# Patient Record
Sex: Female | Born: 2000 | Race: White | Hispanic: No | Marital: Single | Smoking: Never smoker
Health system: Southern US, Community
[De-identification: ages and names within clinical notes are randomized; demographics above are authoritative.]

## PROBLEM LIST (undated history)

## (undated) DIAGNOSIS — H539 Unspecified visual disturbance: Secondary | ICD-10-CM

## (undated) DIAGNOSIS — F419 Anxiety disorder, unspecified: Secondary | ICD-10-CM

## (undated) DIAGNOSIS — F32A Depression, unspecified: Secondary | ICD-10-CM

## (undated) DIAGNOSIS — T7840XA Allergy, unspecified, initial encounter: Secondary | ICD-10-CM

## (undated) DIAGNOSIS — F329 Major depressive disorder, single episode, unspecified: Secondary | ICD-10-CM

## (undated) HISTORY — PX: EYE SURGERY: SHX253

---
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
## 2018-08-04 ENCOUNTER — Ambulatory Visit
Admission: RE | Admit: 2018-08-04 | Discharge: 2018-08-04 | Disposition: A | Discharge status: Home / Self Care | Payer: Self-pay | Source: Ambulatory Visit | Attending: Pediatrics | Admitting: Pediatrics

## 2018-08-04 ENCOUNTER — Other Ambulatory Visit: Payer: Self-pay | Admitting: Pediatrics

## 2018-08-04 DIAGNOSIS — R059 Cough, unspecified: Secondary | ICD-10-CM

## 2018-08-04 DIAGNOSIS — R509 Fever, unspecified: Principal | ICD-10-CM

## 2018-08-04 DIAGNOSIS — R05 Cough: Secondary | ICD-10-CM

## 2018-12-02 ENCOUNTER — Encounter (HOSPITAL_COMMUNITY): Payer: Self-pay | Admitting: Psychiatry

## 2018-12-02 ENCOUNTER — Ambulatory Visit (INDEPENDENT_AMBULATORY_CARE_PROVIDER_SITE_OTHER): Payer: No Typology Code available for payment source | Admitting: Psychiatry

## 2018-12-02 ENCOUNTER — Other Ambulatory Visit: Payer: Self-pay

## 2018-12-02 VITALS — BP 127/80 | HR 78 | Temp 98.5°F | Ht 64.0 in | Wt 173.0 lb

## 2018-12-02 DIAGNOSIS — F4010 Social phobia, unspecified: Secondary | ICD-10-CM | POA: Diagnosis not present

## 2018-12-02 DIAGNOSIS — F4310 Post-traumatic stress disorder, unspecified: Secondary | ICD-10-CM

## 2018-12-02 DIAGNOSIS — F3341 Major depressive disorder, recurrent, in partial remission: Secondary | ICD-10-CM | POA: Diagnosis not present

## 2018-12-02 MED ORDER — FLUOXETINE HCL 20 MG PO TABS: 20.0000 mg | ORAL_TABLET | Freq: Every day | ORAL | 0 refills | Status: DC
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
## 2019-03-04 ENCOUNTER — Ambulatory Visit (INDEPENDENT_AMBULATORY_CARE_PROVIDER_SITE_OTHER): Payer: No Typology Code available for payment source | Admitting: Psychiatry

## 2019-03-04 ENCOUNTER — Other Ambulatory Visit: Payer: Self-pay

## 2019-03-04 DIAGNOSIS — F330 Major depressive disorder, recurrent, mild: Secondary | ICD-10-CM

## 2019-03-04 DIAGNOSIS — F4010 Social phobia, unspecified: Secondary | ICD-10-CM

## 2019-03-04 MED ORDER — FLUOXETINE HCL 20 MG PO TABS
30.0000 mg | ORAL_TABLET | Freq: Every day | ORAL | 0 refills | Status: DC
Start: 2019-03-04 — End: 2019-06-04
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
## 2019-06-04 ENCOUNTER — Ambulatory Visit (INDEPENDENT_AMBULATORY_CARE_PROVIDER_SITE_OTHER): Payer: No Typology Code available for payment source | Admitting: Psychiatry

## 2019-06-04 ENCOUNTER — Other Ambulatory Visit: Payer: Self-pay

## 2019-06-04 DIAGNOSIS — F4010 Social phobia, unspecified: Secondary | ICD-10-CM

## 2019-06-04 DIAGNOSIS — F4310 Post-traumatic stress disorder, unspecified: Secondary | ICD-10-CM | POA: Diagnosis not present

## 2019-06-04 DIAGNOSIS — F3342 Major depressive disorder, recurrent, in full remission: Secondary | ICD-10-CM

## 2019-06-04 MED ORDER — FLUOXETINE HCL 20 MG PO TABS: 30.0000 mg | ORAL_TABLET | Freq: Every day | ORAL | 1 refills | Status: DC
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
## 2019-06-08 ENCOUNTER — Encounter (HOSPITAL_COMMUNITY): Payer: Self-pay

## 2019-09-07 ENCOUNTER — Other Ambulatory Visit: Payer: Self-pay

## 2019-09-07 ENCOUNTER — Ambulatory Visit (INDEPENDENT_AMBULATORY_CARE_PROVIDER_SITE_OTHER): Payer: No Typology Code available for payment source | Admitting: Psychiatry

## 2019-09-07 DIAGNOSIS — F4010 Social phobia, unspecified: Secondary | ICD-10-CM | POA: Diagnosis not present

## 2019-09-07 DIAGNOSIS — F330 Major depressive disorder, recurrent, mild: Secondary | ICD-10-CM

## 2019-09-07 MED ORDER — FLUOXETINE HCL 40 MG PO CAPS: 40.0000 mg | ORAL_CAPSULE | Freq: Every day | ORAL | 1 refills | Status: DC
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
## 2019-11-30 ENCOUNTER — Emergency Department (HOSPITAL_COMMUNITY): Payer: No Typology Code available for payment source

## 2019-11-30 ENCOUNTER — Observation Stay (HOSPITAL_COMMUNITY)
Admission: EM | Admit: 2019-11-30 | Discharge: 2019-12-02 | Disposition: A | Discharge status: Home / Self Care | Payer: No Typology Code available for payment source | Attending: Surgery | Admitting: Surgery

## 2019-11-30 ENCOUNTER — Encounter (HOSPITAL_COMMUNITY): Payer: Self-pay | Admitting: Emergency Medicine

## 2019-11-30 ENCOUNTER — Other Ambulatory Visit: Payer: Self-pay

## 2019-11-30 DIAGNOSIS — F419 Anxiety disorder, unspecified: Secondary | ICD-10-CM | POA: Insufficient documentation

## 2019-11-30 DIAGNOSIS — Z79899 Other long term (current) drug therapy: Secondary | ICD-10-CM | POA: Diagnosis not present

## 2019-11-30 DIAGNOSIS — K5909 Other constipation: Secondary | ICD-10-CM | POA: Diagnosis not present

## 2019-11-30 DIAGNOSIS — R599 Enlarged lymph nodes, unspecified: Secondary | ICD-10-CM | POA: Insufficient documentation

## 2019-11-30 DIAGNOSIS — Z8616 Personal history of COVID-19: Secondary | ICD-10-CM | POA: Diagnosis not present

## 2019-11-30 DIAGNOSIS — K8000 Calculus of gallbladder with acute cholecystitis without obstruction: Secondary | ICD-10-CM | POA: Diagnosis not present

## 2019-11-30 DIAGNOSIS — R350 Frequency of micturition: Secondary | ICD-10-CM | POA: Insufficient documentation

## 2019-11-30 DIAGNOSIS — F329 Major depressive disorder, single episode, unspecified: Secondary | ICD-10-CM | POA: Diagnosis not present

## 2019-11-30 DIAGNOSIS — Z793 Long term (current) use of hormonal contraceptives: Secondary | ICD-10-CM | POA: Insufficient documentation

## 2019-11-30 DIAGNOSIS — R1013 Epigastric pain: Secondary | ICD-10-CM | POA: Diagnosis present

## 2019-11-30 DIAGNOSIS — Z20822 Contact with and (suspected) exposure to covid-19: Secondary | ICD-10-CM | POA: Insufficient documentation

## 2019-11-30 DIAGNOSIS — Z885 Allergy status to narcotic agent status: Secondary | ICD-10-CM | POA: Diagnosis not present

## 2019-11-30 DIAGNOSIS — K810 Acute cholecystitis: Secondary | ICD-10-CM | POA: Diagnosis present

## 2019-11-30 LAB — COMPREHENSIVE METABOLIC PANEL
ALT: 53 U/L — ABNORMAL HIGH (ref 0–44)
AST: 28 U/L (ref 15–41)
Albumin: 4.2 g/dL (ref 3.5–5.0)
Alkaline Phosphatase: 76 U/L (ref 38–126)
Anion gap: 12 (ref 5–15)
BUN: 7 mg/dL (ref 6–20)
CO2: 23 mmol/L (ref 22–32)
Calcium: 9.2 mg/dL (ref 8.9–10.3)
Chloride: 105 mmol/L (ref 98–111)
Creatinine, Ser: 0.52 mg/dL (ref 0.44–1.00)
GFR calc Af Amer: >60 mL/min (ref >60)
GFR calc non Af Amer: >60 mL/min (ref >60)
Glucose, Bld: 124 mg/dL — ABNORMAL HIGH (ref 70–99)
Potassium: 3.6 mmol/L (ref 3.5–5.1)
Sodium: 140 mmol/L (ref 135–145)
Total Bilirubin: 0.3 mg/dL (ref 0.3–1.2)
Total Protein: 7.9 g/dL (ref 6.5–8.1)

## 2019-11-30 LAB — CBC
HCT: 41.5 % (ref 36.0–46.0)
Hemoglobin: 13.3 g/dL (ref 12.0–15.0)
MCH: 28.2 pg (ref 26.0–34.0)
MCHC: 32 g/dL (ref 30.0–36.0)
MCV: 88.1 fL (ref 80.0–100.0)
Platelets: 241 10*3/uL (ref 150–400)
RBC: 4.71 MIL/uL (ref 3.87–5.11)
RDW: 12.6 % (ref 11.5–15.5)
WBC: 6.9 10*3/uL (ref 4.0–10.5)
nRBC: 0 % (ref 0.0–0.2)

## 2019-11-30 LAB — HEPATITIS PANEL, ACUTE
HCV Ab: NONREACTIVE
Hep A IgM: NONREACTIVE
Hep B C IgM: NONREACTIVE
Hepatitis B Surface Ag: NONREACTIVE

## 2019-11-30 LAB — URINALYSIS, ROUTINE W REFLEX MICROSCOPIC
Bilirubin Urine: NEGATIVE
Glucose, UA: NEGATIVE mg/dL
Hgb urine dipstick: NEGATIVE
Ketones, ur: NEGATIVE mg/dL
Leukocytes,Ua: NEGATIVE
Nitrite: NEGATIVE
Protein, ur: NEGATIVE mg/dL
Specific Gravity, Urine: 1.009 (ref 1.005–1.030)
pH: 6 (ref 5.0–8.0)

## 2019-11-30 LAB — RESPIRATORY PANEL BY RT PCR (FLU A&B, COVID)
Influenza A by PCR: NEGATIVE
Influenza B by PCR: NEGATIVE
SARS Coronavirus 2 by RT PCR: NEGATIVE

## 2019-11-30 LAB — I-STAT BETA HCG BLOOD, ED (MC, WL, AP ONLY): I-stat hCG, quantitative: <5 m[IU]/mL (ref <5)

## 2019-11-30 LAB — HIV ANTIBODY (ROUTINE TESTING W REFLEX): HIV Screen 4th Generation wRfx: NONREACTIVE

## 2019-11-30 LAB — LIPASE, BLOOD: Lipase: 30 U/L (ref 11–51)

## 2019-11-30 LAB — LACTIC ACID, PLASMA: Lactic Acid, Venous: 1.2 mmol/L (ref 0.5–1.9)

## 2019-11-30 MED ORDER — OXYCODONE HCL 5 MG PO TABS
5.0000 mg | ORAL_TABLET | ORAL | Status: DC | PRN
  Administered 2019-11-30 – 2019-12-01 (×2): 10 mg via ORAL
  Filled 2019-11-30 (×2): qty 2

## 2019-11-30 MED ORDER — ACETAMINOPHEN 500 MG PO TABS
1000.0000 mg | ORAL_TABLET | ORAL | Status: AC
  Administered 2019-12-01: 10:00:00 1000 mg via ORAL
  Filled 2019-11-30: qty 2

## 2019-11-30 MED ORDER — DIPHENHYDRAMINE HCL 50 MG/ML IJ SOLN
INTRAMUSCULAR | Status: AC
  Filled 2019-11-30: qty 1

## 2019-11-30 MED ORDER — ALUM & MAG HYDROXIDE-SIMETH 200-200-20 MG/5ML PO SUSP
30.0000 mL | Freq: Once | ORAL | Status: AC
  Administered 2019-11-30: 30 mL via ORAL
  Filled 2019-11-30: qty 30

## 2019-11-30 MED ORDER — CELECOXIB 200 MG PO CAPS
400.0000 mg | ORAL_CAPSULE | ORAL | Status: AC
  Administered 2019-12-01: 400 mg via ORAL
  Filled 2019-11-30: qty 2

## 2019-11-30 MED ORDER — ONDANSETRON HCL 4 MG/2ML IJ SOLN
4.0000 mg | Freq: Four times a day (QID) | INTRAMUSCULAR | Status: DC | PRN
  Administered 2019-11-30: 4 mg via INTRAVENOUS
  Filled 2019-11-30: qty 2

## 2019-11-30 MED ORDER — FENTANYL CITRATE (PF) 100 MCG/2ML IJ SOLN
50.0000 ug | Freq: Once | INTRAMUSCULAR | Status: AC
  Administered 2019-11-30: 50 ug via INTRAVENOUS
  Filled 2019-11-30: qty 2

## 2019-11-30 MED ORDER — FENTANYL CITRATE (PF) 100 MCG/2ML IJ SOLN
50.0000 ug | Freq: Once | INTRAMUSCULAR | Status: AC
  Administered 2019-11-30: 12:00:00 50 ug via INTRAVENOUS
  Filled 2019-11-30: qty 2

## 2019-11-30 MED ORDER — ONDANSETRON HCL 4 MG/2ML IJ SOLN
4.0000 mg | Freq: Once | INTRAMUSCULAR | Status: AC
  Administered 2019-11-30: 4 mg via INTRAVENOUS
  Filled 2019-11-30: qty 2

## 2019-11-30 MED ORDER — ENSURE PRE-SURGERY PO LIQD
592.0000 mL | Freq: Once | ORAL | Status: AC
  Administered 2019-11-30: 592 mL via ORAL
  Filled 2019-11-30: qty 592

## 2019-11-30 MED ORDER — SODIUM CHLORIDE 0.9% FLUSH
3.0000 mL | Freq: Once | INTRAVENOUS | Status: AC
  Administered 2019-11-30: 09:00:00 3 mL via INTRAVENOUS

## 2019-11-30 MED ORDER — DIPHENHYDRAMINE HCL 50 MG/ML IJ SOLN
12.5000 mg | Freq: Four times a day (QID) | INTRAMUSCULAR | Status: DC | PRN
  Administered 2019-11-30: 12.5 mg via INTRAVENOUS
  Administered 2019-12-01: 25 mg via INTRAVENOUS
  Filled 2019-11-30 (×2): qty 1

## 2019-11-30 MED ORDER — ENOXAPARIN SODIUM 40 MG/0.4ML ~~LOC~~ SOLN: 40.0000 mg | SUBCUTANEOUS | Status: DC

## 2019-11-30 MED ORDER — SODIUM CHLORIDE 0.9 % IV SOLN
2.0000 g | INTRAVENOUS | Status: DC
  Administered 2019-11-30: 2 g via INTRAVENOUS
  Filled 2019-11-30 (×2): qty 20

## 2019-11-30 MED ORDER — SODIUM CHLORIDE (PF) 0.9 % IJ SOLN
INTRAMUSCULAR | Status: AC
  Filled 2019-11-30: qty 50

## 2019-11-30 MED ORDER — ACETAMINOPHEN 325 MG PO TABS
650.0000 mg | ORAL_TABLET | Freq: Four times a day (QID) | ORAL | Status: DC | PRN
  Administered 2019-12-01: 650 mg via ORAL
  Filled 2019-11-30 (×2): qty 2

## 2019-11-30 MED ORDER — ACETAMINOPHEN 650 MG RE SUPP: 650.0000 mg | Freq: Four times a day (QID) | RECTAL | Status: DC | PRN

## 2019-11-30 MED ORDER — LIDOCAINE VISCOUS HCL 2 % MT SOLN
15.0000 mL | Freq: Once | OROMUCOSAL | Status: AC
  Administered 2019-11-30: 15 mL via ORAL
  Filled 2019-11-30: qty 15

## 2019-11-30 MED ORDER — GABAPENTIN 300 MG PO CAPS
300.0000 mg | ORAL_CAPSULE | ORAL | Status: AC
  Administered 2019-12-01: 10:00:00 300 mg via ORAL
  Filled 2019-11-30: qty 1

## 2019-11-30 MED ORDER — MORPHINE SULFATE (PF) 4 MG/ML IV SOLN
4.0000 mg | Freq: Once | INTRAVENOUS | Status: AC
  Administered 2019-11-30: 4 mg via INTRAVENOUS
  Filled 2019-11-30: qty 1

## 2019-11-30 MED ORDER — HYDROMORPHONE HCL 1 MG/ML IJ SOLN
0.5000 mg | INTRAMUSCULAR | Status: DC | PRN
  Administered 2019-11-30 – 2019-12-01 (×6): 1 mg via INTRAVENOUS
  Filled 2019-11-30 (×6): qty 1

## 2019-11-30 MED ORDER — ONDANSETRON 4 MG PO TBDP
4.0000 mg | ORAL_TABLET | Freq: Four times a day (QID) | ORAL | Status: DC | PRN
  Administered 2019-12-01: 4 mg via ORAL
  Filled 2019-11-30: qty 1

## 2019-11-30 MED ORDER — METOPROLOL TARTRATE 5 MG/5ML IV SOLN: 5.0000 mg | Freq: Four times a day (QID) | INTRAVENOUS | Status: DC | PRN

## 2019-11-30 MED ORDER — IOHEXOL 300 MG/ML  SOLN
100.0000 mL | Freq: Once | INTRAMUSCULAR | Status: AC | PRN
  Administered 2019-11-30: 100 mL via INTRAVENOUS

## 2019-11-30 MED ORDER — DIPHENHYDRAMINE HCL 50 MG/ML IJ SOLN
25.0000 mg | Freq: Once | INTRAMUSCULAR | Status: AC
  Administered 2019-11-30: 10:00:00 25 mg via INTRAVENOUS

## 2019-11-30 MED ORDER — SODIUM CHLORIDE 0.9 % IV BOLUS
1000.0000 mL | Freq: Once | INTRAVENOUS | Status: AC
  Administered 2019-11-30: 1000 mL via INTRAVENOUS

## 2019-11-30 MED ORDER — ENSURE PRE-SURGERY PO LIQD
296.0000 mL | Freq: Once | ORAL | Status: AC
  Administered 2019-12-01: 296 mL via ORAL
  Filled 2019-11-30: qty 296

## 2019-11-30 NOTE — ED Notes (Signed)
Transport called.

## 2019-11-30 NOTE — ED Triage Notes (Signed)
Patient here from home reporting upper abd pain radiating around to lower back increased over 2 days. Nausea no vomiting. Denies pregnancy.

## 2019-11-30 NOTE — ED Provider Notes (Signed)
[facility]-EMERGENCY DEPT Provider Note   CSN: [id number] Arrival date & time: 11/30/19  0857     History Chief Complaint  Patient presents with   Abdominal Pain   Back Pain    Selena Baker is a 19 y.o. female.  The history is provided by the patient and medical records. No language interpreter was used.  Abdominal Pain Pain location:  Epigastric, RUQ and LUQ Pain quality: aching   Pain radiates to:  Back Pain severity:  Severe Onset quality:  Gradual Duration:  2 days Timing:  Constant Progression:  Worsening Chronicity:  New Relieved by:  Nothing Worsened by:  Nothing Ineffective treatments:  Acetaminophen Associated symptoms: constipation (chronic), nausea and vomiting   Associated symptoms: no chest pain, no chills, no cough, no diarrhea, no dysuria, no fatigue, no fever, no shortness of breath, no vaginal bleeding and no vaginal discharge        Past Medical History:  Diagnosis Date   Allergy    Pollen   Anxiety    Depression    Vision abnormalities    Myopia, wears glasses    Patient Active Problem List   Diagnosis Date Noted   PTSD (post-traumatic stress disorder) 12/02/2018   Major depressive disorder, recurrent episode, mild (HCC) 04/07/2013   Social anxiety disorder 04/07/2013    Past Surgical History:  Procedure Laterality Date   EYE SURGERY     both eyes...possible lazy eye     OB History   No obstetric history on file.     Family History  Problem Relation Age of Onset   Breast cancer Mother        finished chemo and radiation therapy    Social History   Tobacco Use   Smoking status: Never Smoker   Smokeless tobacco: Never Used  Substance Use Topics   Alcohol use: No   Drug use: No    Home Medications Prior to Admission medications   Medication Sig Start Date End Date Taking? Authorizing Provider  FLUoxetine (PROZAC) 40 MG capsule Take 1 capsule (40 mg total) by mouth daily. 09/07/19  03/05/20  Pucilowski, Roosvelt Maser, MD  levonorgestrel-ethinyl estradiol (NORDETTE) 0.15-30 MG-MCG tablet  11/05/18   [provider]    Allergies    Patient has no known allergies.  Review of Systems   Review of Systems  Constitutional: Negative for chills, diaphoresis, fatigue and fever.  HENT: Negative for congestion.   Respiratory: Negative for cough, chest tightness, shortness of breath and wheezing.   Cardiovascular: Negative for chest pain, palpitations and leg swelling.  Gastrointestinal: Positive for abdominal pain, constipation (chronic), nausea and vomiting. Negative for abdominal distention and diarrhea.  Genitourinary: Positive for frequency. Negative for dysuria, enuresis, flank pain, vaginal bleeding and vaginal discharge.  Musculoskeletal: Positive for back pain. Negative for neck pain and neck stiffness.  Skin: Negative for rash and wound.  Neurological: Negative for light-headedness and headaches.  Psychiatric/Behavioral: Negative for agitation.  All other systems reviewed and are negative.   Physical Exam Updated Vital Signs BP (!) 136/102 (BP Location: Right Arm)    Pulse 89    Temp 98 F (36.7 C) (Oral)    Resp 19    SpO2 100%   Physical Exam Vitals and nursing note reviewed.  Constitutional:      General: She is not in acute distress.    Appearance: She is well-developed. She is not ill-appearing, toxic-appearing or diaphoretic.  HENT:     Head: Normocephalic and atraumatic.  Right Ear: External ear normal.     Left Ear: External ear normal.     Nose: Nose normal.  Eyes:     General: No scleral icterus.    Conjunctiva/sclera: Conjunctivae normal.  Cardiovascular:     Rate and Rhythm: Normal rate.     Heart sounds: Normal heart sounds. No murmur.  Pulmonary:     Effort: Pulmonary effort is normal. No respiratory distress.     Breath sounds: No stridor. No wheezing, rhonchi or rales.  Chest:     Chest wall: No tenderness.  Abdominal:      General: Abdomen is flat. There is no distension.     Tenderness: There is abdominal tenderness in the right upper quadrant, epigastric area and left upper quadrant. There is left CVA tenderness. There is no right CVA tenderness, guarding or rebound.  Musculoskeletal:     Cervical back: Normal range of motion and neck supple.  Skin:    General: Skin is warm.     Coloration: Skin is not pale.     Findings: No erythema or rash.  Neurological:     General: No focal deficit present.     Mental Status: She is alert.     Motor: No abnormal muscle tone.     Coordination: Coordination normal.     Deep Tendon Reflexes: Reflexes are normal and symmetric.  Psychiatric:        Mood and Affect: Mood is anxious.     ED Results / Procedures / Treatments   Labs (all labs ordered are listed, but only abnormal results are displayed) Labs Reviewed  COMPREHENSIVE METABOLIC PANEL - Abnormal; Notable for the following components:      Result Value   Glucose, Bld 124 (*)    ALT 53 (*)    All other components within normal limits  URINE CULTURE  RESPIRATORY PANEL BY RT PCR (FLU A&B, COVID)  LIPASE, BLOOD  CBC  URINALYSIS, ROUTINE W REFLEX MICROSCOPIC  LACTIC ACID, PLASMA  HEPATITIS PANEL, ACUTE  HIV ANTIBODY (ROUTINE TESTING W REFLEX)  CBC  CREATININE, SERUM  I-STAT BETA HCG BLOOD, ED ([facility], [facility], [facility] ONLY)    EKG None  Radiology CT ABDOMEN PELVIS W CONTRAST  Result Date: 11/30/2019 CLINICAL DATA:  Upper abdominal pain with nausea and vomiting. EXAM: CT ABDOMEN AND PELVIS WITH CONTRAST TECHNIQUE: Multidetector CT imaging of the abdomen and pelvis was performed using the standard protocol following bolus administration of intravenous contrast. CONTRAST:  OMNIPAQUE IOHEXOL 300 MG/ML  SOLN COMPARISON:  Abdominal ultrasound dated 11/30/2019 FINDINGS: Lower chest: Normal. Hepatobiliary: Liver parenchyma is normal. Distention of the gallbladder with edema in the gallbladder wall which was not  apparent on the prior ultrasound exam. No dilated bile ducts. Pancreas: Unremarkable. No pancreatic ductal dilatation or surrounding inflammatory changes. Spleen: Normal in size without focal abnormality. Adrenals/Urinary Tract: Adrenal glands are unremarkable. Kidneys are normal, without renal calculi, focal lesion, or hydronephrosis. Bladder is unremarkable. Stomach/Bowel: Slight edema in the mucosa of the antrum of the stomach. Slight prominence of the mucosa of the colon is felt to be due to lack of distension. The slightly distended mid transverse colon shows no mucosal edema. The small bowel is normal including the terminal ileum. The appendix is normal. Vascular/Lymphatic: No significant vascular findings are present. No enlarged abdominal or pelvic lymph nodes. Reproductive: Uterus and bilateral adnexa are unremarkable. Other: Tiny amount of free fluid in the pelvis, normal for a female of this age. Musculoskeletal: No acute or significant  osseous findings. IMPRESSION: 1. Distention of the gallbladder with edema in the gallbladder wall. This could represent acalculous cholecystitis. 2. Slight edema in the mucosa of the antrum of the stomach. 3. Otherwise benign appearing abdomen and pelvis. Electronically Signed   By: Lorriane Shire M.D.   On: 11/30/2019 13:38   US Abdomen Limited RUQ  Result Date: 11/30/2019 CLINICAL DATA:  Vomiting and abdominal pain EXAM: ULTRASOUND ABDOMEN LIMITED RIGHT UPPER QUADRANT COMPARISON:  None. FINDINGS: Gallbladder: No gallstones or wall thickening visualized. No sonographic Murphy sign noted by sonographer. Common bile duct: Diameter: 3 mm Liver: No focal lesion identified. Within normal limits in parenchymal echogenicity. Portal vein is patent on color Doppler imaging with normal direction of blood flow towards the liver. IMPRESSION: Normal study Electronically Signed   By: Monte Fantasia M.D.   On: 11/30/2019 10:35    Procedures Procedures (including critical care  time)  Medications Ordered in ED Medications  enoxaparin (LOVENOX) injection 40 mg (has no administration in time range)  cefTRIAXone (ROCEPHIN) 2 g in sodium chloride 0.9 % 100 mL IVPB (has no administration in time range)  acetaminophen (TYLENOL) tablet 650 mg (has no administration in time range)    Or  acetaminophen (TYLENOL) suppository 650 mg (has no administration in time range)  oxyCODONE (Oxy IR/ROXICODONE) immediate release tablet 5-10 mg (has no administration in time range)  HYDROmorphone (DILAUDID) injection 0.5-1 mg (has no administration in time range)  ondansetron (ZOFRAN-ODT) disintegrating tablet 4 mg (has no administration in time range)    Or  ondansetron (ZOFRAN) injection 4 mg (has no administration in time range)  metoprolol tartrate (LOPRESSOR) injection 5 mg (has no administration in time range)  sodium chloride flush (NS) 0.9 % injection 3 mL (3 mLs Intravenous Given 11/30/19 0911)  ondansetron (ZOFRAN) injection 4 mg (4 mg Intravenous Given 11/30/19 0929)  sodium chloride 0.9 % bolus 1,000 mL (0 mLs Intravenous Stopped 11/30/19 1028)  alum & mag hydroxide-simeth (MAALOX/MYLANTA) 200-200-20 MG/5ML suspension 30 mL (30 mLs Oral Given 11/30/19 0929)    And  lidocaine (XYLOCAINE) 2 % viscous mouth solution 15 mL (15 mLs Oral Given 11/30/19 0929)  morphine 4 MG/ML injection 4 mg (4 mg Intravenous Given 11/30/19 0929)  diphenhydrAMINE (BENADRYL) injection 25 mg (25 mg Intravenous Given 11/30/19 1025)  ondansetron (ZOFRAN) injection 4 mg (4 mg Intravenous Given 11/30/19 1053)  fentaNYL (SUBLIMAZE) injection 50 mcg (50 mcg Intravenous Given 11/30/19 1209)  iohexol (OMNIPAQUE) 300 MG/ML solution 100 mL (100 mLs Intravenous Contrast Given 11/30/19 1311)  fentaNYL (SUBLIMAZE) injection 50 mcg (50 mcg Intravenous Given 11/30/19 1416)    ED Course  I have reviewed the triage vital signs and the nursing notes.  Pertinent labs & imaging results that were available during my care of  the patient were reviewed by me and considered in my medical decision making (see chart for details).    MDM Rules/Calculators/A&P                      Selena Baker is a 19 y.o. female with a past medical history significant for anxiety, depression, and PTSD who presents with nausea, vomiting, abdominal pain.  She reports that for the last few days she has had worsening pain in her upper abdomen that radiates around towards her back.  She reports has had nausea and vomiting all night with her onset episodes of emesis overnight.  She reports chronic constipation and no changes there if she still passing gas and has  no diarrhea.  She does report urinary frequency but denies dysuria.  She denies any vaginal symptoms and has not missed her menstrual cycle.  She does take birth control medications.  She denies any recent medication changes or alcohol use.  She denies trauma.  She denies fevers, chills, chest pain, shortness of breath, congestion, or cough.  No recent Covid symptoms.  On exam, patient does have tenderness in her upper abdomen.  She also some tenderness in her left CVA area.  Bowel sounds were appreciated.  Lungs were clear and chest was nontender.  Good pulses in extremities.  No lower abdominal tenderness.  Given her upper abdominal pain and tenderness, I suspect gastritis versus hepatitis versus gallbladder disease versus other.  We agreed to get ultrasound given the epigastric and right upper quadrant tenderness as well as give her nausea medicine, pain medicine, fluids.  We will also do a GI cocktail.  With her frequency, will get urine to make sure she is pyelonephritis with the CVA tenderness.  Low suspicion for stone and she has no history of stone or gallbladder disease.  Anticipate reassessment after work-up.  9:36 AM Nursing just reported to me that patient reports that she smoked marijuana yesterday.  I want to clarify with her and patient says that her abdominal symptoms were  present before she smoked marijuana, doubt new marijuana induced hyperemesis based on the timeline.  Awaiting results  10:32 AM Work-up has begun to recurrent is reassuring thus far.  Urinalysis does not show infection.  Lactic acid CBC and CMP overall reassuring.  ALT slightly elevated but do not feel she has significant elevation.  Ultrasound is still in process.  After getting the medications, patient did have a chest erythematous rash develop, unclear which medication started this but it was likely the morphine.  Will hold on further morphine at this time and will give patient Benadryl.  Anticipate reassessment.  Patient may need CT scan if she is still having severe abdominal discomfort.  Patient's abdominal pain continued despite a reassuring ultrasound.  CT was ordered and showed concern for acalculous cholecystitis.  General surgery was called who will admit for further management and possible surgery tomorrow.  A 2-hour Covid was ordered in case she has to go the OR tonight.  Patient be admitted by surgery for further management.   Final Clinical Impression(s) / ED Diagnoses Final diagnoses:  Epigastric abdominal pain    Clinical Impression: 1. Epigastric abdominal pain     Disposition: Admit  This note was prepared with assistance of Dragon voice recognition software. Occasional wrong-word or sound-a-like substitutions may have occurred due to the inherent limitations of voice recognition software.     Jhostin Epps, Canary Brim, MD 11/30/19 [id number]

## 2019-11-30 NOTE — H&P (Signed)
[facility] Admission Note  Selena Baker [birth year]  [id number].    Requesting MD: Tegeler Chief Complaint/Reason for Consult: Acalculous cholecystitis HPI:  Patient is a 19 year old female who presented to [facility] with 3 days of upper abdominal pain, nausea and vomiting. Patient reports that she has had milder episodes of similar pain since June of last year. Current episode pain much more severe and has been constant. Pain in epigastrium and radiates to back. She reports associated nausea and NBNB emesis as well as some diarrhea last Friday. Denies fever, chills, chest pain, SOB, urinary symptoms. Patient has not had any sick contacts with similar symptoms. PMH otherwise significant for depression/anxiety and COVID infection back in August of 2020. She recently also received the Massiah Longanecker&Lela Murfin vaccine a week prior to this past Friday. NKDA. No blood thinning medications. No past abdominal surgery. Patient denies alcohol and tobacco use, occasional marijuana use. She is a Consulting civil engineer at [employer].   ROS: Review of Systems  Constitutional: Negative for chills and fever.  Respiratory: Negative for shortness of breath and wheezing.   Cardiovascular: Negative for chest pain and palpitations.  Gastrointestinal: Positive for abdominal pain, diarrhea, nausea and vomiting. Negative for blood in stool, constipation and melena.  Genitourinary: Negative for dysuria, frequency and urgency.  Musculoskeletal: Positive for back pain.  All other systems reviewed and are negative.   Family History  Problem Relation Age of Onset  . Breast cancer Mother        finished chemo and radiation therapy    Past Medical History:  Diagnosis Date  . Allergy    Pollen  . Anxiety   . Depression   . Vision abnormalities    Myopia, wears glasses    Past Surgical History:  Procedure Laterality Date  . EYE SURGERY     both eyes...possible lazy eye    Social History:  reports that she has never smoked. She  has never used smokeless tobacco. She reports that she does not drink alcohol or use drugs.  Allergies: No Known Allergies  (Not in a hospital admission)   Blood pressure (!) 141/82, pulse 71, temperature 98 F (36.7 C), temperature source Oral, resp. rate 18, last menstrual period 11/02/2019, SpO2 100 %. Physical Exam:  General: pleasant, WD, overweight white female who is laying in bed in NAD HEENT: Sclera are anicteric.  PERRL.  Ears and nose without any masses or lesions.  Mouth is pink and moist Heart: regular, rate, and rhythm.  Normal s1,s2. No obvious murmurs, gallops, or rubs noted.  Palpable radial and pedal pulses bilaterally Lungs: CTAB, no wheezes, rhonchi, or rales noted.  Respiratory effort nonlabored Abd: soft, ttp in RUQ, +Murphy sign, ND, +BS, no masses, hernias, or organomegaly MS: all 4 extremities are symmetrical with no cyanosis, clubbing, or edema. Skin: warm and dry with no masses, lesions, or rashes Neuro: Cranial nerves 2-12 grossly intact, sensation grossly intact throughout Psych: A&Ox3 with an appropriate affect.   Results for orders placed or performed during the hospital encounter of 11/30/19 (from the past 48 hour(s))  Lipase, blood     Status: None   Collection Time: 11/30/19  9:11 AM  Result Value Ref Range   Lipase 30 11 - 51 U/L    Comment: Performed at [facility], [address], [place], [place] [address]  Comprehensive metabolic panel     Status: Abnormal   Collection Time: 11/30/19  9:11 AM  Result Value Ref Range   Sodium 140 135 -  145 mmol/L   Potassium 3.6 3.5 - 5.1 mmol/L   Chloride 105 98 - 111 mmol/L   CO2 23 22 - 32 mmol/L   Glucose, Bld 124 (H) 70 - 99 mg/dL    Comment: Glucose reference range applies only to samples taken after fasting for at least 8 hours.   BUN 7 6 - 20 mg/dL   Creatinine, Ser 3.81 0.44 - 1.00 mg/dL   Calcium 9.2 8.9 - 01.7 mg/dL   Total Protein 7.9 6.5 - 8.1 g/dL   Albumin 4.2 3.5 - 5.0  g/dL   AST 28 15 - 41 U/L   ALT 53 (H) 0 - 44 U/L   Alkaline Phosphatase 76 38 - 126 U/L   Total Bilirubin 0.3 0.3 - 1.2 mg/dL   GFR calc non Af Amer >60 >60 mL/min   GFR calc Af Amer >60 >60 mL/min   Anion gap 12 5 - 15    Comment: Performed at [facility], [address] [place], [place] [address]  CBC     Status: None   Collection Time: 11/30/19  9:11 AM  Result Value Ref Range   WBC 6.9 4.0 - 10.5 K/uL   RBC 4.71 3.87 - 5.11 MIL/uL   Hemoglobin 13.3 12.0 - 15.0 g/dL   HCT 85.2 77.8 - 24.2 %   MCV 88.1 80.0 - 100.0 fL   MCH 28.2 26.0 - 34.0 pg   MCHC 32.0 30.0 - 36.0 g/dL   RDW 35.3 61.4 - 43.1 %   Platelets 241 150 - 400 K/uL   nRBC 0.0 0.0 - 0.2 %    Comment: Performed at [facility], [address], [place], [place] [address]  I-Stat beta hCG blood, ED     Status: None   Collection Time: 11/30/19  9:25 AM  Result Value Ref Range   I-stat hCG, quantitative <5.0 <5 mIU/mL   Comment 3            Comment:   GEST. AGE      CONC.  (mIU/mL)   <=1 WEEK        5 - 50     2 WEEKS       50 - 500     3 WEEKS       100 - 10,000     4 WEEKS     1,000 - 30,000        FEMALE AND NON-PREGNANT FEMALE:     LESS THAN 5 mIU/mL   Lactic acid, plasma     Status: None   Collection Time: 11/30/19  9:28 AM  Result Value Ref Range   Lactic Acid, Venous 1.2 0.5 - 1.9 mmol/L    Comment: Performed at [facility], [address] [place], [place] [address]  Urinalysis, Routine w reflex microscopic     Status: None   Collection Time: 11/30/19  9:57 AM  Result Value Ref Range   Color, Urine YELLOW YELLOW   APPearance CLEAR CLEAR   Specific Gravity, Urine 1.009 1.005 - 1.030   pH 6.0 5.0 - 8.0   Glucose, UA NEGATIVE NEGATIVE mg/dL   Hgb urine dipstick NEGATIVE NEGATIVE   Bilirubin Urine NEGATIVE NEGATIVE   Ketones, ur NEGATIVE NEGATIVE mg/dL   Protein, ur NEGATIVE NEGATIVE mg/dL   Nitrite NEGATIVE NEGATIVE   Leukocytes,Ua NEGATIVE  NEGATIVE    Comment: Performed at [facility], [address], [place], [place] [address]   CT  ABDOMEN PELVIS W CONTRAST  Result Date: 11/30/2019 CLINICAL DATA:  Upper abdominal pain with nausea and vomiting. EXAM: CT ABDOMEN AND PELVIS WITH CONTRAST TECHNIQUE: Multidetector CT imaging of the abdomen and pelvis was performed using the standard protocol following bolus administration of intravenous contrast. CONTRAST:  115mL OMNIPAQUE IOHEXOL 300 MG/ML  SOLN COMPARISON:  Abdominal ultrasound dated 11/30/2019 FINDINGS: Lower chest: Normal. Hepatobiliary: Liver parenchyma is normal. Distention of the gallbladder with edema in the gallbladder wall which was not apparent on the prior ultrasound exam. No dilated bile ducts. Pancreas: Unremarkable. No pancreatic ductal dilatation or surrounding inflammatory changes. Spleen: Normal in size without focal abnormality. Adrenals/Urinary Tract: Adrenal glands are unremarkable. Kidneys are normal, without renal calculi, focal lesion, or hydronephrosis. Bladder is unremarkable. Stomach/Bowel: Slight edema in the mucosa of the antrum of the stomach. Slight prominence of the mucosa of the colon is felt to be due to lack of distension. The slightly distended mid transverse colon shows no mucosal edema. The small bowel is normal including the terminal ileum. The appendix is normal. Vascular/Lymphatic: No significant vascular findings are present. No enlarged abdominal or pelvic lymph nodes. Reproductive: Uterus and bilateral adnexa are unremarkable. Other: Tiny amount of free fluid in the pelvis, normal for a female of this age. Musculoskeletal: No acute or significant osseous findings. IMPRESSION: 1. Distention of the gallbladder with edema in the gallbladder wall. This could represent acalculous cholecystitis. 2. Slight edema in the mucosa of the antrum of the stomach. 3. Otherwise benign appearing abdomen and pelvis. Electronically Signed   By: Lorriane Shire M.D.   On: 11/30/2019 13:38   US Abdomen Limited RUQ  Result Date: 11/30/2019 CLINICAL DATA:  Vomiting and abdominal pain EXAM: ULTRASOUND ABDOMEN LIMITED RIGHT UPPER QUADRANT COMPARISON:  None. FINDINGS: Gallbladder: No gallstones or wall thickening visualized. No sonographic Murphy sign noted by sonographer. Common bile duct: Diameter: 3 mm Liver: No focal lesion identified. Within normal limits in parenchymal echogenicity. Portal vein is patent on color Doppler imaging with normal direction of blood flow towards the liver. IMPRESSION: Normal study Electronically Signed   By: Monte Fantasia M.D.   On: 11/30/2019 10:35      Assessment/Plan Acalculous cholecystitis - [place] normal, CT with mild edema in gallbladder wall - ALT mildly elevated - LFTs otherwise normal - WBC 6.9 and patient is afebrile - exam with TTP in RUQ and positive Murphy sign - discussed with attending provider and HIDA not felt to be needed at this time given CT findings - check hepatitis panel and repeat labs in AM - ok to have CLD but will make NPO after MN and likely plan for lap chole in the AM  Admit to observation. Reassess for possible OR tomorrow morning.   Brigid Re, [facility] Surgery 11/30/2019, 2:53 PM Please see Amion for pager number during day hours 7:00am-4:30pm

## 2019-12-01 ENCOUNTER — Observation Stay (HOSPITAL_COMMUNITY): Payer: No Typology Code available for payment source | Admitting: Anesthesiology

## 2019-12-01 ENCOUNTER — Ambulatory Visit (INDEPENDENT_AMBULATORY_CARE_PROVIDER_SITE_OTHER): Payer: No Typology Code available for payment source | Admitting: Psychiatry

## 2019-12-01 ENCOUNTER — Encounter (HOSPITAL_COMMUNITY): Payer: Self-pay | Admitting: Surgery

## 2019-12-01 ENCOUNTER — Observation Stay (HOSPITAL_COMMUNITY): Payer: No Typology Code available for payment source

## 2019-12-01 ENCOUNTER — Encounter (HOSPITAL_COMMUNITY)
Admission: EM | Disposition: A | Discharge status: Home / Self Care | Payer: Self-pay | Source: Home / Self Care | Attending: Emergency Medicine

## 2019-12-01 DIAGNOSIS — F331 Major depressive disorder, recurrent, moderate: Secondary | ICD-10-CM

## 2019-12-01 DIAGNOSIS — F330 Major depressive disorder, recurrent, mild: Secondary | ICD-10-CM

## 2019-12-01 DIAGNOSIS — K8000 Calculus of gallbladder with acute cholecystitis without obstruction: Secondary | ICD-10-CM | POA: Diagnosis not present

## 2019-12-01 DIAGNOSIS — Z20822 Contact with and (suspected) exposure to covid-19: Secondary | ICD-10-CM | POA: Diagnosis not present

## 2019-12-01 DIAGNOSIS — F4010 Social phobia, unspecified: Secondary | ICD-10-CM

## 2019-12-01 DIAGNOSIS — R599 Enlarged lymph nodes, unspecified: Secondary | ICD-10-CM | POA: Diagnosis not present

## 2019-12-01 DIAGNOSIS — Z8616 Personal history of COVID-19: Secondary | ICD-10-CM | POA: Diagnosis not present

## 2019-12-01 HISTORY — PX: CHOLECYSTECTOMY: SHX55

## 2019-12-01 LAB — COMPREHENSIVE METABOLIC PANEL
ALT: 60 U/L — ABNORMAL HIGH (ref 0–44)
AST: 39 U/L (ref 15–41)
Albumin: 3.7 g/dL (ref 3.5–5.0)
Alkaline Phosphatase: 81 U/L (ref 38–126)
Anion gap: 7 (ref 5–15)
BUN: <5 mg/dL — ABNORMAL LOW (ref 6–20)
CO2: 25 mmol/L (ref 22–32)
Calcium: 8.9 mg/dL (ref 8.9–10.3)
Chloride: 104 mmol/L (ref 98–111)
Creatinine, Ser: 0.61 mg/dL (ref 0.44–1.00)
GFR calc Af Amer: >60 mL/min (ref >60)
GFR calc non Af Amer: >60 mL/min (ref >60)
Glucose, Bld: 118 mg/dL — ABNORMAL HIGH (ref 70–99)
Potassium: 4.2 mmol/L (ref 3.5–5.1)
Sodium: 136 mmol/L (ref 135–145)
Total Bilirubin: 0.7 mg/dL (ref 0.3–1.2)
Total Protein: 7 g/dL (ref 6.5–8.1)

## 2019-12-01 LAB — CBC
HCT: 38 % (ref 36.0–46.0)
Hemoglobin: 12.4 g/dL (ref 12.0–15.0)
MCH: 29 pg (ref 26.0–34.0)
MCHC: 32.6 g/dL (ref 30.0–36.0)
MCV: 88.8 fL (ref 80.0–100.0)
Platelets: 201 10*3/uL (ref 150–400)
RBC: 4.28 MIL/uL (ref 3.87–5.11)
RDW: 12.8 % (ref 11.5–15.5)
WBC: 6.6 10*3/uL (ref 4.0–10.5)
nRBC: 0 % (ref 0.0–0.2)

## 2019-12-01 LAB — URINE CULTURE: Culture: NO GROWTH

## 2019-12-01 SURGERY — LAPAROSCOPIC CHOLECYSTECTOMY WITH INTRAOPERATIVE CHOLANGIOGRAM
Anesthesia: General | Site: Abdomen

## 2019-12-01 MED ORDER — SODIUM CHLORIDE 0.9 % IV SOLN: INTRAVENOUS | Status: DC

## 2019-12-01 MED ORDER — ENOXAPARIN SODIUM 40 MG/0.4ML ~~LOC~~ SOLN
40.0000 mg | SUBCUTANEOUS | Status: DC
  Administered 2019-12-02: 40 mg via SUBCUTANEOUS
  Filled 2019-12-01: qty 0.4

## 2019-12-01 MED ORDER — SODIUM CHLORIDE 0.9 % IV SOLN
INTRAVENOUS | Status: DC | PRN
  Administered 2019-12-01: 12:00:00 15 mL

## 2019-12-01 MED ORDER — PROPOFOL 10 MG/ML IV BOLUS
INTRAVENOUS | Status: DC | PRN
  Administered 2019-12-01: 200 mg via INTRAVENOUS

## 2019-12-01 MED ORDER — HYDROMORPHONE HCL 1 MG/ML IJ SOLN
INTRAMUSCULAR | Status: AC
  Filled 2019-12-01: qty 1

## 2019-12-01 MED ORDER — FENTANYL CITRATE (PF) 100 MCG/2ML IJ SOLN
INTRAMUSCULAR | Status: AC
  Filled 2019-12-01: qty 2

## 2019-12-01 MED ORDER — BUPIVACAINE HCL (PF) 0.25 % IJ SOLN
INTRAMUSCULAR | Status: DC | PRN
  Administered 2019-12-01: 20 mL

## 2019-12-01 MED ORDER — ROCURONIUM BROMIDE 10 MG/ML (PF) SYRINGE
PREFILLED_SYRINGE | INTRAVENOUS | Status: DC | PRN
  Administered 2019-12-01: 70 mg via INTRAVENOUS

## 2019-12-01 MED ORDER — HYDROCORTISONE 1 % EX CREA
TOPICAL_CREAM | CUTANEOUS | Status: DC | PRN
  Filled 2019-12-01 (×2): qty 28

## 2019-12-01 MED ORDER — FENTANYL CITRATE (PF) 250 MCG/5ML IJ SOLN
INTRAMUSCULAR | Status: AC
  Filled 2019-12-01: qty 5

## 2019-12-01 MED ORDER — SCOPOLAMINE 1 MG/3DAYS TD PT72: 1.0000 | MEDICATED_PATCH | TRANSDERMAL | Status: DC

## 2019-12-01 MED ORDER — HYDROMORPHONE HCL 1 MG/ML IJ SOLN
0.2500 mg | INTRAMUSCULAR | Status: DC | PRN
  Administered 2019-12-01 (×3): 0.5 mg via INTRAVENOUS

## 2019-12-01 MED ORDER — SIMETHICONE 80 MG PO CHEW
40.0000 mg | CHEWABLE_TABLET | Freq: Four times a day (QID) | ORAL | Status: DC | PRN
  Administered 2019-12-02: 40 mg via ORAL
  Filled 2019-12-01: qty 1

## 2019-12-01 MED ORDER — MIDAZOLAM HCL 2 MG/2ML IJ SOLN
INTRAMUSCULAR | Status: AC
  Filled 2019-12-01: qty 2

## 2019-12-01 MED ORDER — DIPHENHYDRAMINE HCL 50 MG/ML IJ SOLN
INTRAMUSCULAR | Status: DC | PRN
  Administered 2019-12-01: 12.5 mg via INTRAVENOUS

## 2019-12-01 MED ORDER — ONDANSETRON HCL 4 MG/2ML IJ SOLN
INTRAMUSCULAR | Status: DC | PRN
  Administered 2019-12-01: 4 mg via INTRAVENOUS

## 2019-12-01 MED ORDER — POLYETHYLENE GLYCOL 3350 17 G PO PACK: 17.0000 g | PACK | Freq: Every day | ORAL | Status: DC | PRN

## 2019-12-01 MED ORDER — ONDANSETRON 4 MG PO TBDP: 4.0000 mg | ORAL_TABLET | Freq: Four times a day (QID) | ORAL | Status: DC | PRN

## 2019-12-01 MED ORDER — GABAPENTIN 300 MG PO CAPS
300.0000 mg | ORAL_CAPSULE | Freq: Two times a day (BID) | ORAL | Status: DC
  Administered 2019-12-01 – 2019-12-02 (×2): 300 mg via ORAL
  Filled 2019-12-01 (×2): qty 1

## 2019-12-01 MED ORDER — BUPIVACAINE HCL 0.25 % IJ SOLN
INTRAMUSCULAR | Status: AC
  Filled 2019-12-01: qty 1

## 2019-12-01 MED ORDER — DEXAMETHASONE SODIUM PHOSPHATE 10 MG/ML IJ SOLN
INTRAMUSCULAR | Status: DC | PRN
  Administered 2019-12-01: 10 mg via INTRAVENOUS

## 2019-12-01 MED ORDER — FENTANYL CITRATE (PF) 100 MCG/2ML IJ SOLN
25.0000 ug | INTRAMUSCULAR | Status: DC | PRN
  Administered 2019-12-01 (×3): 50 ug via INTRAVENOUS

## 2019-12-01 MED ORDER — ACETAMINOPHEN 500 MG PO TABS
1000.0000 mg | ORAL_TABLET | Freq: Four times a day (QID) | ORAL | Status: DC
  Administered 2019-12-01 – 2019-12-02 (×4): 1000 mg via ORAL
  Filled 2019-12-01 (×4): qty 2

## 2019-12-01 MED ORDER — FENTANYL CITRATE (PF) 250 MCG/5ML IJ SOLN
INTRAMUSCULAR | Status: DC | PRN
  Administered 2019-12-01: 50 ug via INTRAVENOUS
  Administered 2019-12-01: 100 ug via INTRAVENOUS
  Administered 2019-12-01 (×2): 50 ug via INTRAVENOUS

## 2019-12-01 MED ORDER — PROMETHAZINE HCL 25 MG/ML IJ SOLN
6.2500 mg | INTRAMUSCULAR | Status: DC | PRN
  Administered 2019-12-01: 13:00:00 6.25 mg via INTRAVENOUS

## 2019-12-01 MED ORDER — ONDANSETRON HCL 4 MG/2ML IJ SOLN
INTRAMUSCULAR | Status: AC
  Filled 2019-12-01: qty 2

## 2019-12-01 MED ORDER — SUGAMMADEX SODIUM 200 MG/2ML IV SOLN
INTRAVENOUS | Status: DC | PRN
  Administered 2019-12-01: 160 mg via INTRAVENOUS

## 2019-12-01 MED ORDER — SCOPOLAMINE 1 MG/3DAYS TD PT72
MEDICATED_PATCH | TRANSDERMAL | Status: AC
  Administered 2019-12-01: 1.5 mg via TRANSDERMAL
  Filled 2019-12-01: qty 1

## 2019-12-01 MED ORDER — PROMETHAZINE HCL 25 MG/ML IJ SOLN
INTRAMUSCULAR | Status: AC
  Filled 2019-12-01: qty 1

## 2019-12-01 MED ORDER — LACTATED RINGERS IV SOLN: INTRAVENOUS | Status: DC

## 2019-12-01 MED ORDER — KETOROLAC TROMETHAMINE 30 MG/ML IJ SOLN
30.0000 mg | Freq: Four times a day (QID) | INTRAMUSCULAR | Status: DC | PRN
  Administered 2019-12-01 – 2019-12-02 (×3): 30 mg via INTRAVENOUS
  Filled 2019-12-01 (×3): qty 1

## 2019-12-01 MED ORDER — 0.9 % SODIUM CHLORIDE (POUR BTL) OPTIME
TOPICAL | Status: DC | PRN
  Administered 2019-12-01: 12:00:00 1000 mL

## 2019-12-01 MED ORDER — LIDOCAINE 2% (20 MG/ML) 5 ML SYRINGE
INTRAMUSCULAR | Status: DC | PRN
  Administered 2019-12-01: 80 mg via INTRAVENOUS

## 2019-12-01 MED ORDER — ROCURONIUM BROMIDE 10 MG/ML (PF) SYRINGE
PREFILLED_SYRINGE | INTRAVENOUS | Status: AC
  Filled 2019-12-01: qty 10

## 2019-12-01 MED ORDER — FLUOXETINE HCL 20 MG PO CAPS
40.0000 mg | ORAL_CAPSULE | Freq: Every day | ORAL | Status: DC
  Administered 2019-12-01 – 2019-12-02 (×2): 40 mg via ORAL
  Filled 2019-12-01 (×2): qty 2

## 2019-12-01 MED ORDER — METOPROLOL TARTRATE 5 MG/5ML IV SOLN: 5.0000 mg | Freq: Four times a day (QID) | INTRAVENOUS | Status: DC | PRN

## 2019-12-01 MED ORDER — FLUOXETINE HCL 60 MG PO TABS: 20.0000 mg | ORAL_TABLET | Freq: Every day | ORAL | 0 refills | Status: DC

## 2019-12-01 MED ORDER — OXYCODONE HCL 5 MG PO TABS
5.0000 mg | ORAL_TABLET | ORAL | Status: DC | PRN
  Administered 2019-12-01 (×2): 5 mg via ORAL
  Administered 2019-12-02: 10 mg via ORAL
  Filled 2019-12-01: qty 1
  Filled 2019-12-01 (×2): qty 2

## 2019-12-01 MED ORDER — LACTATED RINGERS IR SOLN
Status: DC | PRN
  Administered 2019-12-01: 1000 mL

## 2019-12-01 MED ORDER — MIDAZOLAM HCL 2 MG/2ML IJ SOLN
INTRAMUSCULAR | Status: DC | PRN
  Administered 2019-12-01 (×2): 1 mg via INTRAVENOUS

## 2019-12-01 MED ORDER — DEXAMETHASONE SODIUM PHOSPHATE 10 MG/ML IJ SOLN
INTRAMUSCULAR | Status: AC
  Filled 2019-12-01: qty 1

## 2019-12-01 MED ORDER — HYDROMORPHONE HCL 1 MG/ML IJ SOLN
0.5000 mg | INTRAMUSCULAR | Status: DC | PRN
  Administered 2019-12-02: 0.5 mg via INTRAVENOUS
  Filled 2019-12-01: qty 0.5

## 2019-12-01 MED ORDER — ONDANSETRON HCL 4 MG/2ML IJ SOLN: 4.0000 mg | Freq: Four times a day (QID) | INTRAMUSCULAR | Status: DC | PRN

## 2019-12-01 MED ORDER — PROPOFOL 10 MG/ML IV BOLUS
INTRAVENOUS | Status: AC
  Filled 2019-12-01: qty 20

## 2019-12-01 MED ORDER — DIPHENHYDRAMINE HCL 50 MG/ML IJ SOLN
INTRAMUSCULAR | Status: AC
  Filled 2019-12-01: qty 1

## 2019-12-01 MED ORDER — ESMOLOL HCL 100 MG/10ML IV SOLN
INTRAVENOUS | Status: DC | PRN
  Administered 2019-12-01 (×2): 15 mg via INTRAVENOUS

## 2019-12-01 MED ORDER — LIDOCAINE 2% (20 MG/ML) 5 ML SYRINGE
INTRAMUSCULAR | Status: AC
  Filled 2019-12-01: qty 5

## 2019-12-01 SURGICAL SUPPLY — 51 items
ADH SKN CLS APL DERMABOND .7 (GAUZE/BANDAGES/DRESSINGS) ×1
APL PRP STRL LF DISP 70% ISPRP (MISCELLANEOUS) ×1
APL SKNCLS STERI-STRIP NONHPOA (GAUZE/BANDAGES/DRESSINGS) ×1
APPLIER CLIP ROT 10 11.4 M/L (STAPLE) ×3
APR CLP MED LRG 11.4X10 (STAPLE) ×1
BAG SPEC RTRVL LRG 6X4 10 (ENDOMECHANICALS) ×1
BENZOIN TINCTURE PRP APPL 2/3 (GAUZE/BANDAGES/DRESSINGS) ×3 IMPLANT
CHLORAPREP W/TINT 26 (MISCELLANEOUS) ×3 IMPLANT
CLIP APPLIE ROT 10 11.4 M/L (STAPLE) ×1 IMPLANT
CLOSURE WOUND 1/2 X4 (GAUZE/BANDAGES/DRESSINGS) ×1
COVER MAYO STAND STRL (DRAPES) ×3 IMPLANT
COVER SURGICAL LIGHT HANDLE (MISCELLANEOUS) ×3 IMPLANT
COVER WAND RF STERILE (DRAPES) IMPLANT
DECANTER SPIKE VIAL GLASS SM (MISCELLANEOUS) ×3 IMPLANT
DERMABOND ADVANCED (GAUZE/BANDAGES/DRESSINGS) ×2
DERMABOND ADVANCED .7 DNX12 (GAUZE/BANDAGES/DRESSINGS) IMPLANT
DRAPE C-ARM 42X120 X-RAY (DRAPES) ×3 IMPLANT
DRAPE UTILITY XL STRL (DRAPES) ×3 IMPLANT
DRSG TEGADERM 2-3/8X2-3/4 SM (GAUZE/BANDAGES/DRESSINGS) ×5 IMPLANT
DRSG TEGADERM 4X4.75 (GAUZE/BANDAGES/DRESSINGS) ×3 IMPLANT
ELECT REM PT RETURN 15FT ADLT (MISCELLANEOUS) ×3 IMPLANT
FILTER SMOKE EVAC LAPAROSHD (FILTER) ×3 IMPLANT
GAUZE SPONGE 2X2 8PLY STRL LF (GAUZE/BANDAGES/DRESSINGS) IMPLANT
GLOVE BIO SURGEON STRL SZ7 (GLOVE) ×3 IMPLANT
GLOVE BIOGEL PI IND STRL 7.5 (GLOVE) ×1 IMPLANT
GLOVE BIOGEL PI INDICATOR 7.5 (GLOVE) ×2
GOWN STRL REUS W/TWL LRG LVL3 (GOWN DISPOSABLE) ×3 IMPLANT
GOWN STRL REUS W/TWL XL LVL3 (GOWN DISPOSABLE) ×6 IMPLANT
HEMOSTAT SNOW SURGICEL 2X4 (HEMOSTASIS) ×2 IMPLANT
KIT BASIN OR (CUSTOM PROCEDURE TRAY) ×3 IMPLANT
KIT TURNOVER KIT A (KITS) IMPLANT
NS IRRIG 1000ML POUR BTL (IV SOLUTION) ×3 IMPLANT
PENCIL SMOKE EVACUATOR (MISCELLANEOUS) IMPLANT
POUCH SPECIMEN RETRIEVAL 10MM (ENDOMECHANICALS) ×3 IMPLANT
PROTECTOR NERVE ULNAR (MISCELLANEOUS) IMPLANT
SCISSORS LAP 5X35 DISP (ENDOMECHANICALS) ×3 IMPLANT
SET CHOLANGIOGRAPH MIX (MISCELLANEOUS) ×3 IMPLANT
SET IRRIG TUBING LAPAROSCOPIC (IRRIGATION / IRRIGATOR) ×3 IMPLANT
SET TUBE SMOKE EVAC HIGH FLOW (TUBING) IMPLANT
SPONGE GAUZE 2X2 STER 10/PKG (GAUZE/BANDAGES/DRESSINGS) ×6
STRIP CLOSURE SKIN 1/2X4 (GAUZE/BANDAGES/DRESSINGS) ×2 IMPLANT
SUT MNCRL AB 4-0 PS2 18 (SUTURE) ×3 IMPLANT
SYR 20ML LL LF (SYRINGE) IMPLANT
TAPE CLOTH 4X10 WHT NS (GAUZE/BANDAGES/DRESSINGS) IMPLANT
TOWEL OR 17X26 10 PK STRL BLUE (TOWEL DISPOSABLE) ×3 IMPLANT
TOWEL OR NON WOVEN STRL DISP B (DISPOSABLE) ×3 IMPLANT
TRAY LAPAROSCOPIC (CUSTOM PROCEDURE TRAY) ×3 IMPLANT
TROCAR BLADELESS OPT 5 100 (ENDOMECHANICALS) ×6 IMPLANT
TROCAR XCEL BLUNT TIP 100MML (ENDOMECHANICALS) ×3 IMPLANT
TROCAR XCEL NON-BLD 11X100MML (ENDOMECHANICALS) ×3 IMPLANT
WATER STERILE IRR 1000ML POUR (IV SOLUTION) ×3 IMPLANT

## 2019-12-01 NOTE — Progress Notes (Signed)
Day of Surgery   Subjective/Chief Complaint: Still with some RUQ tenderness   Objective: Vital signs in last 24 hours: Temp:  [98 F (36.7 C)-98.9 F (37.2 C)] 98.2 F (36.8 C) (03/23 0537) Pulse Rate:  [68-96] 96 (03/23 0537) Resp:  [15-19] 18 (03/23 0537) BP: (111-141)/(51-119) 118/51 (03/23 0537) SpO2:  [98 %-100 %] 98 % (03/23 0537) Weight:  [78.5 kg] 78.5 kg (03/22 1804) Last BM Date: 11/29/19  Intake/Output from previous day: 03/22 0701 - 03/23 0700 In: 1340 [P.O.:240; IV Piggyback:1100] Out: -  Intake/Output this shift: No intake/output data recorded.  General appearance: alert, cooperative and no distress GI: soft, minimal RUQ tenderness Skin: Skin color, texture, turgor normal. No rashes or lesions or jaundice  Lab Results:  Recent Labs    11/30/19 0911 12/01/19 0439  WBC 6.9 6.6  HGB 13.3 12.4  HCT 41.5 38.0  PLT 241 201   BMET Recent Labs    11/30/19 0911 12/01/19 0439  NA 140 136  K 3.6 4.2  CL 105 104  CO2 23 25  GLUCOSE 124* 118*  BUN 7 <5*  CREATININE 0.52 0.61  CALCIUM 9.2 8.9   PT/INR No results for input(s): LABPROT, INR in the last 72 hours. ABG No results for input(s): PHART, HCO3 in the last 72 hours.  Invalid input(s): PCO2, PO2  Studies/Results: CT ABDOMEN PELVIS W CONTRAST  Result Date: 11/30/2019 CLINICAL DATA:  Upper abdominal pain with nausea and vomiting. EXAM: CT ABDOMEN AND PELVIS WITH CONTRAST TECHNIQUE: Multidetector CT imaging of the abdomen and pelvis was performed using the standard protocol following bolus administration of intravenous contrast. CONTRAST:  169mL OMNIPAQUE IOHEXOL 300 MG/ML  SOLN COMPARISON:  Abdominal ultrasound dated 11/30/2019 FINDINGS: Lower chest: Normal. Hepatobiliary: Liver parenchyma is normal. Distention of the gallbladder with edema in the gallbladder wall which was not apparent on the prior ultrasound exam. No dilated bile ducts. Pancreas: Unremarkable. No pancreatic ductal dilatation or  surrounding inflammatory changes. Spleen: Normal in size without focal abnormality. Adrenals/Urinary Tract: Adrenal glands are unremarkable. Kidneys are normal, without renal calculi, focal lesion, or hydronephrosis. Bladder is unremarkable. Stomach/Bowel: Slight edema in the mucosa of the antrum of the stomach. Slight prominence of the mucosa of the colon is felt to be due to lack of distension. The slightly distended mid transverse colon shows no mucosal edema. The small bowel is normal including the terminal ileum. The appendix is normal. Vascular/Lymphatic: No significant vascular findings are present. No enlarged abdominal or pelvic lymph nodes. Reproductive: Uterus and bilateral adnexa are unremarkable. Other: Tiny amount of free fluid in the pelvis, normal for a female of this age. Musculoskeletal: No acute or significant osseous findings. IMPRESSION: 1. Distention of the gallbladder with edema in the gallbladder wall. This could represent acalculous cholecystitis. 2. Slight edema in the mucosa of the antrum of the stomach. 3. Otherwise benign appearing abdomen and pelvis. Electronically Signed   By: Lorriane Shire M.D.   On: 11/30/2019 13:38   US Abdomen Limited RUQ  Result Date: 11/30/2019 CLINICAL DATA:  Vomiting and abdominal pain EXAM: ULTRASOUND ABDOMEN LIMITED RIGHT UPPER QUADRANT COMPARISON:  None. FINDINGS: Gallbladder: No gallstones or wall thickening visualized. No sonographic Murphy sign noted by sonographer. Common bile duct: Diameter: 3 mm Liver: No focal lesion identified. Within normal limits in parenchymal echogenicity. Portal vein is patent on color Doppler imaging with normal direction of blood flow towards the liver. IMPRESSION: Normal study Electronically Signed   By: Monte Fantasia M.D.   On: 11/30/2019  10:35    Anti-infectives: Anti-infectives (From admission, onward)   Start     Dose/Rate Route Frequency Ordered Stop   11/30/19 1530  cefTRIAXone (ROCEPHIN) 2 g in sodium  chloride 0.9 % 100 mL IVPB     2 g 200 mL/hr over 30 Minutes Intravenous Every 24 hours 11/30/19 1508        Assessment/Plan: Acute cholecystitis  Plan Laparoscopic cholecystectomy with intraoperative cholangiogram today.  The surgical procedure has been discussed with the patient and her mother.  Potential risks, benefits, alternative treatments, and expected outcomes have been explained.  All of the patient's questions at this time have been answered.  The likelihood of reaching the patient's treatment goal is good.  The patient understands the proposed surgical procedure and wishes to proceed.   LOS: 0 days    Wynona Luna 12/01/2019

## 2019-12-01 NOTE — Op Note (Signed)
Laparoscopic Cholecystectomy with IOC Procedure Note  Indications: This patient presents with symptomatic gallbladder disease and will undergo laparoscopic cholecystectomy.  Pre-operative Diagnosis: Acute cholecystitis  Post-operative Diagnosis: Same  Surgeon: Wynona Luna   Assistants: Trixie Deis, PA-C  Anesthesia: General endotracheal anesthesia  ASA Class: 1  Procedure Details  The patient was seen again in the Holding Room. The risks, benefits, complications, treatment options, and expected outcomes were discussed with the patient. The possibilities of reaction to medication, pulmonary aspiration, perforation of viscus, bleeding, recurrent infection, finding a normal gallbladder, the need for additional procedures, failure to diagnose a condition, the possible need to convert to an open procedure, and creating a complication requiring transfusion or operation were discussed with the patient. The likelihood of improving the patient's symptoms with return to their baseline status is good.  The patient and/or family concurred with the proposed plan, giving informed consent. The site of surgery properly noted. The patient was taken to Operating Room, identified as Selena Baker and the procedure verified as Laparoscopic Cholecystectomy with Intraoperative Cholangiogram. A Time Out was held and the above information confirmed.  Prior to the induction of general anesthesia, antibiotic prophylaxis was administered. General endotracheal anesthesia was then administered and tolerated well. After the induction, the abdomen was prepped with Chloraprep and draped in the sterile fashion. The patient was positioned in the supine position.  Local anesthetic agent was injected into the skin below the umbilicus and an incision made. We dissected down to the abdominal fascia with blunt dissection.  The fascia was incised vertically and we entered the peritoneal cavity bluntly.  A pursestring suture of  0-Vicryl was placed around the fascial opening.  The Hasson cannula was inserted and secured with the stay suture.  Pneumoperitoneum was then created with CO2 and tolerated well without any adverse changes in the patient's vital signs. An 11-mm port was placed in the subxiphoid position.  Two 5-mm ports were placed in the right upper quadrant. All skin incisions were infiltrated with a local anesthetic agent before making the incision and placing the trocars.   We positioned the patient in reverse Trendelenburg, tilted slightly to the patient's left.  The gallbladder was identified, the fundus grasped and retracted cephalad.  The gallbladder is very thickened and edematous.   Adhesions were lysed bluntly and with the electrocautery where indicated, taking care not to injure any adjacent organs or viscus. The infundibulum was grasped and retracted laterally, exposing the peritoneum overlying the triangle of Calot. This was then divided and exposed in a blunt fashion. A critical view of the cystic duct and cystic artery was obtained.  The cystic duct was clearly identified and bluntly dissected circumferentially. The cystic duct was ligated with a clip distally.   An incision was made in the cystic duct and the [facility] cholangiogram catheter introduced. The catheter was secured using a clip. A cholangiogram was then obtained which showed good visualization of the distal and proximal biliary tree with no sign of filling defects or obstruction.  Contrast flowed easily into the duodenum. The catheter was then removed.   The cystic duct was then ligated with clips and divided. The cystic artery was identified, dissected free, ligated with clips and divided as well.   The gallbladder was dissected from the liver bed in retrograde fashion with the electrocautery. The gallbladder was removed and placed in an Endocatch sac. The liver bed was irrigated and inspected. Hemostasis was achieved with the electrocautery and SNOW.  Copious irrigation was utilized  and was repeatedly aspirated until clear.  The gallbladder and Endocatch sac were then removed through the umbilical port site.  The pursestring suture was used to close the umbilical fascia.    We again inspected the right upper quadrant for hemostasis.  Pneumoperitoneum was released as we removed the trocars.  4-0 Monocryl was used to close the skin.   Benzoin, steri-strips, and clean dressings were applied. The patient was then extubated and brought to the recovery room in stable condition. Instrument, sponge, and needle counts were correct at closure and at the conclusion of the case.   Findings: Acute cholecystitis  Estimated Blood Loss: less than 50 mL         Drains: none         Specimens: Gallbladder           Complications: None; patient tolerated the procedure well.         Disposition: PACU - hemodynamically stable.         Condition: stable  Selena Burn. Georgette Dover, MD, [facility] Surgery  General/ Trauma Surgery   12/01/2019 12:21 PM

## 2019-12-01 NOTE — Anesthesia Postprocedure Evaluation (Signed)
Anesthesia Post Note  Patient: Sales promotion account executive  Procedure(s) Performed: LAPAROSCOPIC CHOLECYSTECTOMY WITH INTRAOPERATIVE CHOLANGIOGRAM (N/A Abdomen)     Patient location during evaluation: PACU Anesthesia Type: General Level of consciousness: awake and alert Pain management: pain level controlled Vital Signs Assessment: post-procedure vital signs reviewed and stable Respiratory status: spontaneous breathing, nonlabored ventilation, respiratory function stable and patient connected to nasal cannula oxygen Cardiovascular status: blood pressure returned to baseline and stable Postop Assessment: no apparent nausea or vomiting Anesthetic complications: no    Last Vitals:  Vitals:   12/01/19 1315 12/01/19 1330  BP: 118/68 125/79  Pulse: 91 98  Resp: 16 (!) 24  Temp:  36.9 C  SpO2: 99% 100%    Last Pain:  Vitals:   12/01/19 1330  TempSrc:   PainSc: 8                  Trevor Iha

## 2019-12-01 NOTE — Anesthesia Procedure Notes (Signed)
Procedure Name: Intubation Date/Time: 12/01/2019 11:09 AM Performed by: Florene Route, CRNA Patient Re-evaluated:Patient Re-evaluated prior to induction Oxygen Delivery Method: Circle system utilized Preoxygenation: Pre-oxygenation with 100% oxygen Induction Type: IV induction Ventilation: Mask ventilation without difficulty and Oral airway inserted - appropriate to patient size Laryngoscope Size: Hyacinth Meeker and 2 Grade View: Grade I Tube type: Oral Tube size: 7.5 mm Number of attempts: 1 Airway Equipment and Method: Stylet Placement Confirmation: ETT inserted through vocal cords under direct vision,  positive ETCO2 and breath sounds checked- equal and bilateral Secured at: 21 cm Tube secured with: Tape Dental Injury: Teeth and Oropharynx as per pre-operative assessment

## 2019-12-01 NOTE — Anesthesia Preprocedure Evaluation (Addendum)
Anesthesia Evaluation  Patient identified by MRN, date of birth, ID band Patient awake    Reviewed: Allergy & Precautions, NPO status , Patient's Chart, lab work & pertinent test results  History of Anesthesia Complications Negative for: history of anesthetic complications  Airway Mallampati: II  TM Distance: >3 FB Neck ROM: Full    Dental no notable dental hx. (+) Dental Advisory Given   Pulmonary neg pulmonary ROS,    Pulmonary exam normal        Cardiovascular negative cardio ROS Normal cardiovascular exam     Neuro/Psych PSYCHIATRIC DISORDERS Anxiety Depression negative neurological ROS     GI/Hepatic Neg liver ROS,   Endo/Other  negative endocrine ROS  Renal/GU negative Renal ROS     Musculoskeletal negative musculoskeletal ROS (+)   Abdominal   Peds  Hematology negative hematology ROS (+)   Anesthesia Other Findings Day of surgery medications reviewed with the patient.  Reproductive/Obstetrics                            Anesthesia Physical Anesthesia Plan  ASA: II  Anesthesia Plan: General   Post-op Pain Management:    Induction: Intravenous  PONV Risk Score and Plan: 4 or greater and Ondansetron, Dexamethasone, Scopolamine patch - Pre-op and Diphenhydramine  Airway Management Planned: Oral ETT  Additional Equipment:   Intra-op Plan:   Post-operative Plan: Extubation in OR  Informed Consent: I have reviewed the patients History and Physical, chart, labs and discussed the procedure including the risks, benefits and alternatives for the proposed anesthesia with the patient or authorized representative who has indicated his/her understanding and acceptance.     Dental advisory given  Plan Discussed with: Anesthesiologist and CRNA  Anesthesia Plan Comments:        Anesthesia Quick Evaluation

## 2019-12-01 NOTE — Transfer of Care (Signed)
Immediate Anesthesia Transfer of Care Note  Patient: Selena Baker  Procedure(s) Performed: LAPAROSCOPIC CHOLECYSTECTOMY WITH INTRAOPERATIVE CHOLANGIOGRAM (N/A Abdomen)  Patient Location: PACU  Anesthesia Type:General  Level of Consciousness: awake  Airway & Oxygen Therapy: Patient Spontanous Breathing and Patient connected to face mask oxygen  Post-op Assessment: Report given to RN and Post -op Vital signs reviewed and stable  Post vital signs: Reviewed and stable  Last Vitals:  Vitals Value Taken Time  BP 129/77 12/01/19 1236  Temp    Pulse 93 12/01/19 1237  Resp 15 12/01/19 1237  SpO2 100 % 12/01/19 1237  Vitals shown include unvalidated device data.  Last Pain:  Vitals:   12/01/19 1021  TempSrc: Oral  PainSc:       Patients Stated Pain Goal: 1 (12/01/19 0844)  Complications: No apparent anesthesia complications

## 2019-12-01 NOTE — Discharge Instructions (Signed)
[facility] LAPAROSCOPIC SURGERY: POST OP INSTRUCTIONS Always review your discharge instruction sheet given to you by the facility where your surgery was performed. IF YOU HAVE DISABILITY OR FAMILY LEAVE FORMS, YOU MUST BRING THEM TO THE OFFICE FOR PROCESSING.   DO NOT GIVE THEM TO YOUR DOCTOR.  PAIN CONTROL  1. First take acetaminophen (Tylenol) AND/or ibuprofen (Advil) to control your pain after surgery.  Follow directions on package.  Taking acetaminophen (Tylenol) and/or ibuprofen (Advil) regularly after surgery will help to control your pain and lower the amount of prescription pain medication you may need.  You should not take more than 3,000 mg (3 grams) of acetaminophen (Tylenol) in 24 hours.  You should not take ibuprofen (Advil), aleve, motrin, naprosyn or other NSAIDS if you have a history of stomach ulcers or chronic kidney disease.  2. A prescription for pain medication may be given to you upon discharge.  Take your pain medication as prescribed, if you still have uncontrolled pain after taking acetaminophen (Tylenol) or ibuprofen (Advil). 3. Use ice packs to help control pain. 4. If you need a refill on your pain medication, please contact your pharmacy.  They will contact our office to request authorization. Prescriptions will not be filled after 5pm or on week-ends.  HOME MEDICATIONS 5. Take your usually prescribed medications unless otherwise directed.  DIET 6. You should follow a light diet the first few days after arrival home.  Be sure to include lots of fluids daily. Avoid fatty, fried foods.   CONSTIPATION 7. It is common to experience some constipation after surgery and if you are taking pain medication.  Increasing fluid intake and taking a stool softener (such as Colace) will usually help or prevent this problem from occurring.  A mild laxative (Milk of Magnesia or Miralax) should be taken according to package instructions if there are no bowel  movements after 48 hours.  WOUND/INCISION CARE 8. Most patients will experience some swelling and bruising in the area of the incisions.  Ice packs will help.  Swelling and bruising can take several days to resolve.  9. Unless discharge instructions indicate otherwise, follow guidelines below  a. STERI-STRIPS - you may remove your outer bandages 48 hours after surgery, and you may shower at that time.  You have steri-strips (small skin tapes) in place directly over the incision.  These strips should be left on the skin for 7-10 days.   b. DERMABOND/SKIN GLUE - you may shower in 24 hours.  The glue will flake off over the next 2-3 weeks. 10. Any sutures or staples will be removed at the office during your follow-up visit.  ACTIVITIES 11. You may resume regular (light) daily activities beginning the next day--such as daily self-care, walking, climbing stairs--gradually increasing activities as tolerated.  You may have sexual intercourse when it is comfortable.  Refrain from any heavy lifting or straining until approved by your doctor. a. You may drive when you are no longer taking prescription pain medication, you can comfortably wear a seatbelt, and you can safely maneuver your car and apply brakes.  FOLLOW-UP 12. You should see your doctor in the office for a follow-up appointment approximately 2-3 weeks after your surgery.  You should have been given your post-op/follow-up appointment when your surgery was scheduled.  If you did not receive a post-op/follow-up appointment, make sure that you call for this appointment within a day or two after you arrive home to insure a convenient appointment time.     WHEN TO CALL YOUR DOCTOR: 1. Fever over 101.0 2. Inability to urinate 3. Continued bleeding from incision. 4. Increased pain, redness, or drainage from the incision. 5. Increasing abdominal pain  The clinic staff is available to answer your questions during regular business hours.  Please don't  hesitate to call and ask to speak to one of the nurses for clinical concerns.  If you have a medical emergency, go to the nearest emergency room or call 911.  A surgeon from [facility] is always on call at the hospital. [address], [address], [place], [place]  [address] ? [address] [address], [place], [place]   [address] [phone] ? [phone] ? FAX [phone] Web site: [email or web address]  .........   Managing Your Pain After Surgery Without Opioids    Thank you for participating in our program to help patients manage their pain after surgery without opioids. This is part of our effort to provide you with the best care possible, without exposing you or your family to the risk that opioids pose.  What pain can I expect after surgery? You can expect to have some pain after surgery. This is normal. The pain is typically worse the day after surgery, and quickly begins to get better. Many studies have found that many patients are able to manage their pain after surgery with Over-the-Counter (OTC) medications such as Tylenol and Motrin. If you have a condition that does not allow you to take Tylenol or Motrin, notify your surgical team.  How will I manage my pain? The best strategy for controlling your pain after surgery is around the clock pain control with Tylenol (acetaminophen) and Motrin (ibuprofen or Advil). Alternating these medications with each other allows you to maximize your pain control. In addition to Tylenol and Motrin, you can use heating pads or ice packs on your incisions to help reduce your pain.  How will I alternate your regular strength over-the-counter pain medication? You will take a dose of pain medication every three hours. ; Start by taking 650 mg of Tylenol (2 pills of 325 mg) ; 3 hours later take 600 mg of Motrin (3 pills of 200 mg) ; 3 hours after taking the Motrin take 650 mg of Tylenol ; 3 hours after that take 600 mg of  Motrin.   - 1 -  See example - if your first dose of Tylenol is at 12:00 PM   12:00 PM Tylenol 650 mg (2 pills of 325 mg)  3:00 PM Motrin 600 mg (3 pills of 200 mg)  6:00 PM Tylenol 650 mg (2 pills of 325 mg)  9:00 PM Motrin 600 mg (3 pills of 200 mg)  Continue alternating every 3 hours   We recommend that you follow this schedule around-the-clock for at least 3 days after surgery, or until you feel that it is no longer needed. Use the table on the last page of this handout to keep track of the medications you are taking. Important: Do not take more than 3000mg of Tylenol or 3200mg of Motrin in a 24-hour period. Do not take ibuprofen/Motrin if you have a history of bleeding stomach ulcers, severe kidney disease, &/or actively taking a blood thinner  What if I still have pain? If you have pain that is not controlled with the over-the-counter pain medications (Tylenol and Motrin or Advil) you might have what we call "breakthrough" pain. You will receive a prescription for a small amount of an opioid pain medication such as   Oxycodone, Tramadol, or Tylenol with Codeine. Use these opioid pills in the first 24 hours after surgery if you have breakthrough pain. Do not take more than 1 pill every 4-6 hours.  If you still have uncontrolled pain after using all opioid pills, don't hesitate to call our staff using the number provided. We will help make sure you are managing your pain in the best way possible, and if necessary, we can provide a prescription for additional pain medication.   Day 1    Time  Name of Medication Number of pills taken  Amount of Acetaminophen  Pain Level   Comments  AM PM       AM PM       AM PM       AM PM       AM PM       AM PM       AM PM       AM PM       Total Daily amount of Acetaminophen Do not take more than  3,000 mg per day      Day 2    Time  Name of Medication Number of pills taken  Amount of Acetaminophen  Pain Level   Comments  AM  PM       AM PM       AM PM       AM PM       AM PM       AM PM       AM PM       AM PM       Total Daily amount of Acetaminophen Do not take more than  3,000 mg per day      Day 3    Time  Name of Medication Number of pills taken  Amount of Acetaminophen  Pain Level   Comments  AM PM       AM PM       AM PM       AM PM          AM PM       AM PM       AM PM       AM PM       Total Daily amount of Acetaminophen Do not take more than  3,000 mg per day      Day 4    Time  Name of Medication Number of pills taken  Amount of Acetaminophen  Pain Level   Comments  AM PM       AM PM       AM PM       AM PM       AM PM       AM PM       AM PM       AM PM       Total Daily amount of Acetaminophen Do not take more than  3,000 mg per day      Day 5    Time  Name of Medication Number of pills taken  Amount of Acetaminophen  Pain Level   Comments  AM PM       AM PM       AM PM       AM PM       AM PM       AM PM       AM PM         AM PM       Total Daily amount of Acetaminophen Do not take more than  3,000 mg per day       Day 6    Time  Name of Medication Number of pills taken  Amount of Acetaminophen  Pain Level  Comments  AM PM       AM PM       AM PM       AM PM       AM PM       AM PM       AM PM       AM PM       Total Daily amount of Acetaminophen Do not take more than  3,000 mg per day      Day 7    Time  Name of Medication Number of pills taken  Amount of Acetaminophen  Pain Level   Comments  AM PM       AM PM       AM PM       AM PM       AM PM       AM PM       AM PM       AM PM       Total Daily amount of Acetaminophen Do not take more than  3,000 mg per day        For additional information about how and where to safely dispose of unused opioid medications - https://www.morepowerfulnc.org  Disclaimer: This document contains information and/or instructional materials adapted from [facility]  for the typical patient with your condition. It does not replace medical advice from your health care provider because your experience may differ from that of the typical patient. Talk to your health care provider if you have any questions about this document, your condition or your treatment plan. Adapted from [facility]  
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
## 2019-12-02 ENCOUNTER — Other Ambulatory Visit (HOSPITAL_COMMUNITY): Payer: Self-pay | Admitting: Psychiatry

## 2019-12-02 ENCOUNTER — Other Ambulatory Visit (HOSPITAL_COMMUNITY): Payer: Self-pay | Admitting: *Deleted

## 2019-12-02 LAB — SURGICAL PATHOLOGY

## 2019-12-02 MED ORDER — IBUPROFEN 200 MG PO TABS: 600.0000 mg | ORAL_TABLET | Freq: Four times a day (QID) | ORAL | Status: AC | PRN

## 2019-12-02 MED ORDER — TRAMADOL HCL 50 MG PO TABS: 50.0000 mg | ORAL_TABLET | Freq: Four times a day (QID) | ORAL | Status: DC | PRN

## 2019-12-02 MED ORDER — FLUOXETINE HCL 60 MG PO TABS: 60.0000 mg | ORAL_TABLET | Freq: Every day | ORAL | 0 refills | Status: DC

## 2019-12-02 MED ORDER — ACETAMINOPHEN 500 MG PO TABS: 1000.0000 mg | ORAL_TABLET | Freq: Four times a day (QID) | ORAL | Status: AC | PRN

## 2019-12-02 MED ORDER — IBUPROFEN 400 MG PO TABS
400.0000 mg | ORAL_TABLET | Freq: Four times a day (QID) | ORAL | Status: DC
  Administered 2019-12-02: 400 mg via ORAL
  Filled 2019-12-02: qty 1

## 2019-12-02 MED ORDER — SIMETHICONE 80 MG PO CHEW: 40.0000 mg | CHEWABLE_TABLET | Freq: Four times a day (QID) | ORAL | Status: AC | PRN

## 2019-12-02 MED ORDER — TRAMADOL HCL 50 MG PO TABS: 50.0000 mg | ORAL_TABLET | Freq: Four times a day (QID) | ORAL | 0 refills | Status: AC | PRN

## 2019-12-02 NOTE — Discharge Summary (Signed)
[facility] Discharge Summary   Patient ID: Selena Baker MRN: [id number] DOB/AGE: 21/15/2002 18 y.o.  Admit date: 11/30/2019 Discharge date: 12/02/2019  Admitting Diagnosis: Symptomatic cholelithiasis   Discharge Diagnosis Patient Active Problem List   Diagnosis Date Noted  . Acute acalculous cholecystitis 11/30/2019  . PTSD (post-traumatic stress disorder) 12/02/2018  . Major depressive disorder, recurrent episode, moderate (HCC) 04/07/2013  . Social anxiety disorder 04/07/2013    Consultants None   Imaging: CT ABDOMEN PELVIS W CONTRAST  Result Date: 11/30/2019 CLINICAL DATA:  Upper abdominal pain with nausea and vomiting. EXAM: CT ABDOMEN AND PELVIS WITH CONTRAST TECHNIQUE: Multidetector CT imaging of the abdomen and pelvis was performed using the standard protocol following bolus administration of intravenous contrast. CONTRAST:  OMNIPAQUE IOHEXOL 300 MG/ML  SOLN COMPARISON:  Abdominal ultrasound dated 11/30/2019 FINDINGS: Lower chest: Normal. Hepatobiliary: Liver parenchyma is normal. Distention of the gallbladder with edema in the gallbladder wall which was not apparent on the prior ultrasound exam. No dilated bile ducts. Pancreas: Unremarkable. No pancreatic ductal dilatation or surrounding inflammatory changes. Spleen: Normal in size without focal abnormality. Adrenals/Urinary Tract: Adrenal glands are unremarkable. Kidneys are normal, without renal calculi, focal lesion, or hydronephrosis. Bladder is unremarkable. Stomach/Bowel: Slight edema in the mucosa of the antrum of the stomach. Slight prominence of the mucosa of the colon is felt to be due to lack of distension. The slightly distended mid transverse colon shows no mucosal edema. The small bowel is normal including the terminal ileum. The appendix is normal. Vascular/Lymphatic: No significant vascular findings are present. No enlarged abdominal or pelvic lymph nodes. Reproductive: Uterus and bilateral adnexa are  unremarkable. Other: Tiny amount of free fluid in the pelvis, normal for a female of this age. Musculoskeletal: No acute or significant osseous findings. IMPRESSION: 1. Distention of the gallbladder with edema in the gallbladder wall. This could represent acalculous cholecystitis. 2. Slight edema in the mucosa of the antrum of the stomach. 3. Otherwise benign appearing abdomen and pelvis. Electronically Signed   By: Francene Boyers M.D.   On: 11/30/2019 13:38   DG C-Arm 1-60 Min-No Report  Result Date: 12/01/2019 Fluoroscopy was utilized by the requesting physician.  No radiographic interpretation.   US Abdomen Limited RUQ  Result Date: 11/30/2019 CLINICAL DATA:  Vomiting and abdominal pain EXAM: ULTRASOUND ABDOMEN LIMITED RIGHT UPPER QUADRANT COMPARISON:  None. FINDINGS: Gallbladder: No gallstones or wall thickening visualized. No sonographic Murphy sign noted by sonographer. Common bile duct: Diameter: 3 mm Liver: No focal lesion identified. Within normal limits in parenchymal echogenicity. Portal vein is patent on color Doppler imaging with normal direction of blood flow towards the liver. IMPRESSION: Normal study Electronically Signed   By: Marnee Spring M.D.   On: 11/30/2019 10:35    Procedures Dr. Manus Rudd (12/01/2019) - Laparoscopic Cholecystectomy with [facility]  Hospital Course:  19 y/o female who presented to [facility] with 3 days of abdominal pain  .  Workup showed gallbladder wall thickening on CT and mildly elevated ALT. She was TTP in the RUQ on exam.  Patient was admitted and underwent procedure listed above.  Tolerated procedure well and was transferred to the floor.  Diet was advanced as tolerated.  On POD#1, the patient was voiding well, tolerating diet, ambulating well, pain well controlled, vital signs stable, incisions c/d/i and felt stable for discharge home.  Patient will follow up in our office in 2 weeks and knows to call with questions or concerns. She will call to confirm  appointment  date/time.    Physical Exam: General:  Alert, NAD, pleasant, comfortable Abd:  Soft, ND, mild tenderness, incisions C/D/I  Allergies as of 12/02/2019      Reactions   Morphine And Related Rash      Medication List    TAKE these medications   acetaminophen 500 MG tablet Commonly known as: TYLENOL Take 2 tablets (1,000 mg total) by mouth every 6 (six) hours as needed for mild pain or fever.   FLUoxetine HCl 60 MG Tabs Take 20 mg by mouth daily.   ibuprofen 200 MG tablet Commonly known as: Motrin IB Take 3 tablets (600 mg total) by mouth every 6 (six) hours as needed for mild pain or moderate pain.   levonorgestrel-ethinyl estradiol 0.15-30 MG-MCG tablet Commonly known as: NORDETTE Take 1 tablet by mouth daily.   simethicone 80 MG chewable tablet Commonly known as: MYLICON Chew 0.5 tablets (40 mg total) by mouth every 6 (six) hours as needed for flatulence (bloating).   traMADol 50 MG tablet Commonly known as: ULTRAM Take 1 tablet (50 mg total) by mouth every 6 (six) hours as needed for moderate pain or severe pain.        Follow-up Information    Surgery, [place]. Go on 12/22/2019.   Specialty: General Surgery Why: Follow up appointment scheduled for 11:45 AM. Please arrive 30 min prior to appointment time. Bring photo ID and insurance information with you.  Contact information: [address] [place] [address] [phone]           Signed: Obie Dredge, [facility] 12/02/2019, 10:05 AM

## 2019-12-03 LAB — NASOPHARYNGEAL CULTURE
Culture: NORMAL
Special Requests: NORMAL

## 2019-12-29 ENCOUNTER — Telehealth (INDEPENDENT_AMBULATORY_CARE_PROVIDER_SITE_OTHER): Payer: No Typology Code available for payment source | Admitting: Psychiatry

## 2019-12-29 ENCOUNTER — Other Ambulatory Visit: Payer: Self-pay

## 2019-12-29 DIAGNOSIS — F3341 Major depressive disorder, recurrent, in partial remission: Secondary | ICD-10-CM | POA: Diagnosis not present

## 2019-12-29 DIAGNOSIS — F4010 Social phobia, unspecified: Secondary | ICD-10-CM | POA: Diagnosis not present

## 2019-12-29 DIAGNOSIS — F4310 Post-traumatic stress disorder, unspecified: Secondary | ICD-10-CM

## 2019-12-29 MED ORDER — FLUOXETINE HCL 60 MG PO TABS: 60.0000 mg | ORAL_TABLET | Freq: Every day | ORAL | 1 refills | Status: DC
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
## 2019-12-31 ENCOUNTER — Encounter (HOSPITAL_COMMUNITY): Payer: Self-pay | Admitting: Psychiatry

## 2020-01-04 ENCOUNTER — Encounter (HOSPITAL_COMMUNITY): Payer: Self-pay

## 2020-01-05 ENCOUNTER — Encounter (HOSPITAL_COMMUNITY): Payer: Self-pay

## 2020-01-13 ENCOUNTER — Other Ambulatory Visit: Payer: Self-pay

## 2020-01-13 ENCOUNTER — Telehealth (INDEPENDENT_AMBULATORY_CARE_PROVIDER_SITE_OTHER): Payer: No Typology Code available for payment source | Admitting: Psychiatry

## 2020-01-13 DIAGNOSIS — F4010 Social phobia, unspecified: Secondary | ICD-10-CM | POA: Diagnosis not present

## 2020-01-13 DIAGNOSIS — F3342 Major depressive disorder, recurrent, in full remission: Secondary | ICD-10-CM | POA: Diagnosis not present
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
## 2020-03-21 IMAGING — CR DG CHEST 2V
2 series · 2 of 2 positions shown · non-contrast
Comparison: None.

CLINICAL DATA: Dry cough with fever for 6 days.

EXAM:
CHEST - 2 VIEW

[w chest pa]
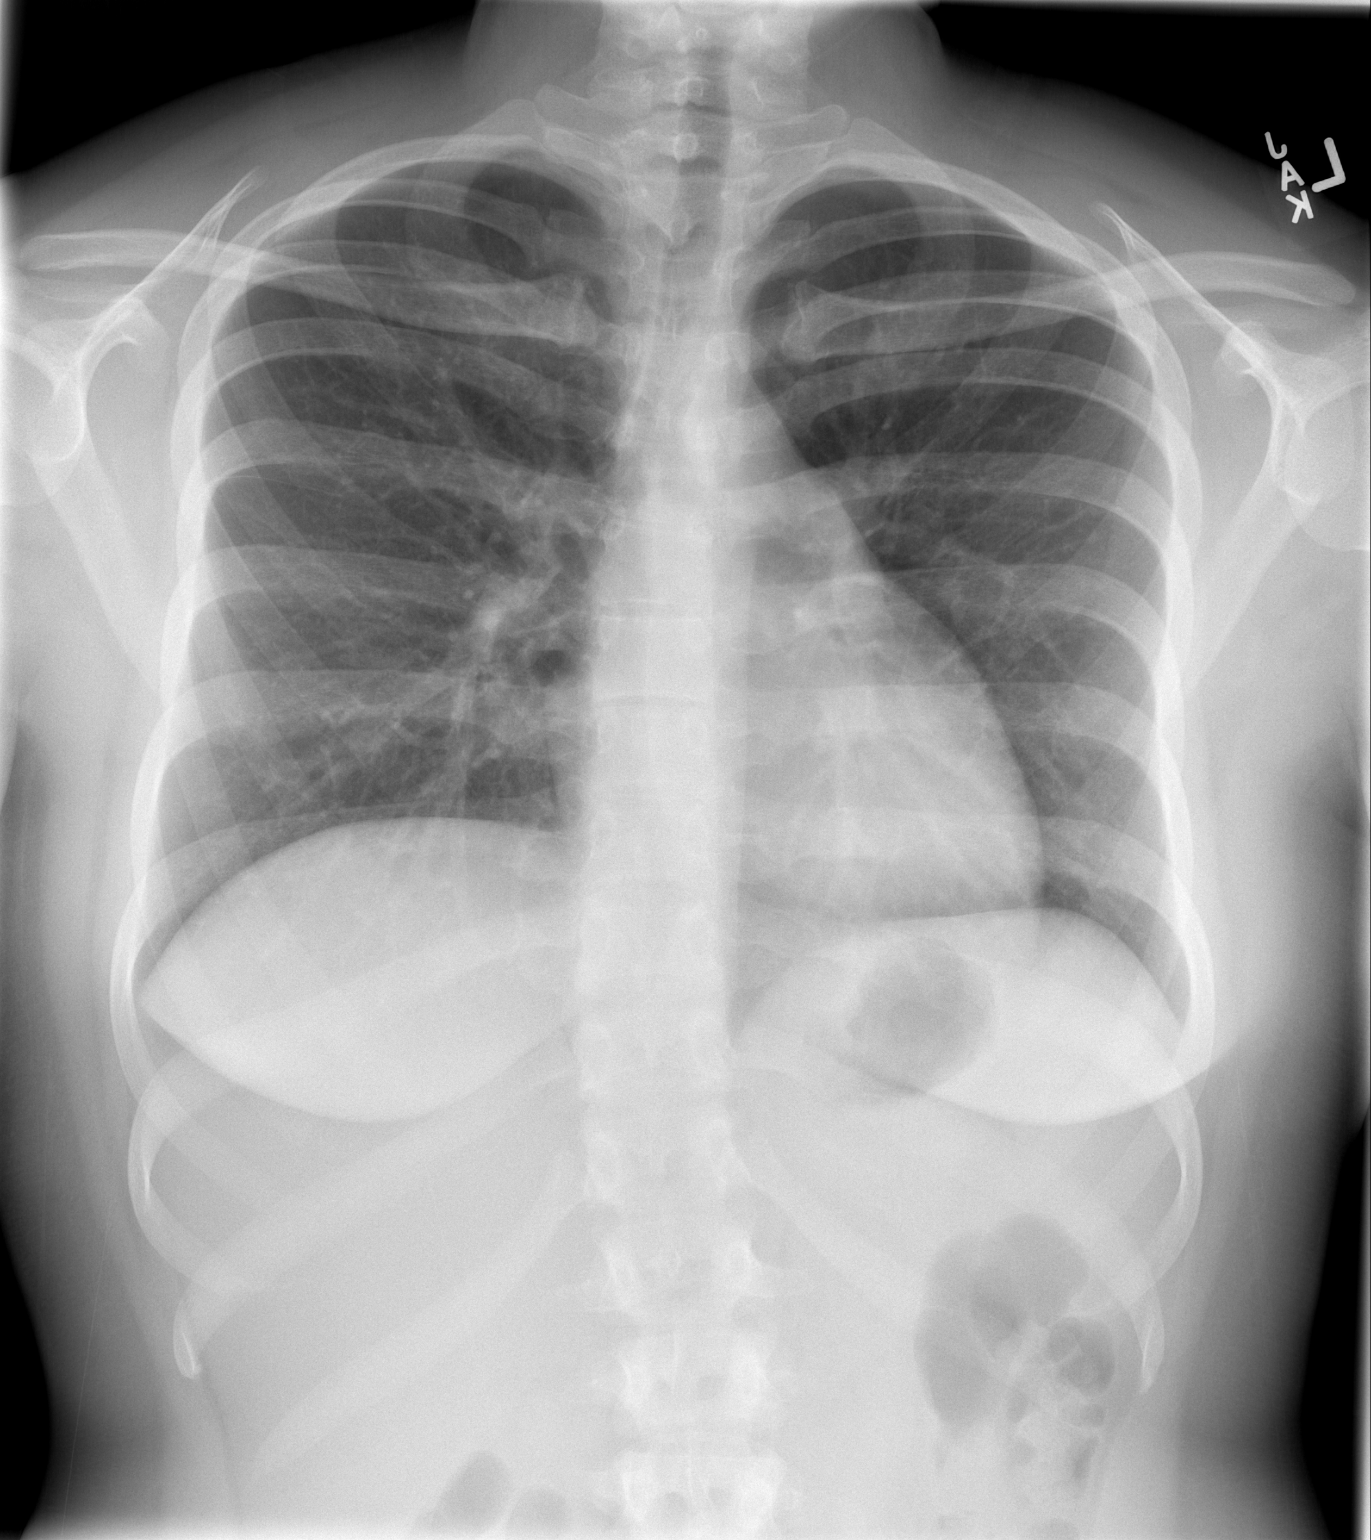

[w chest lat]
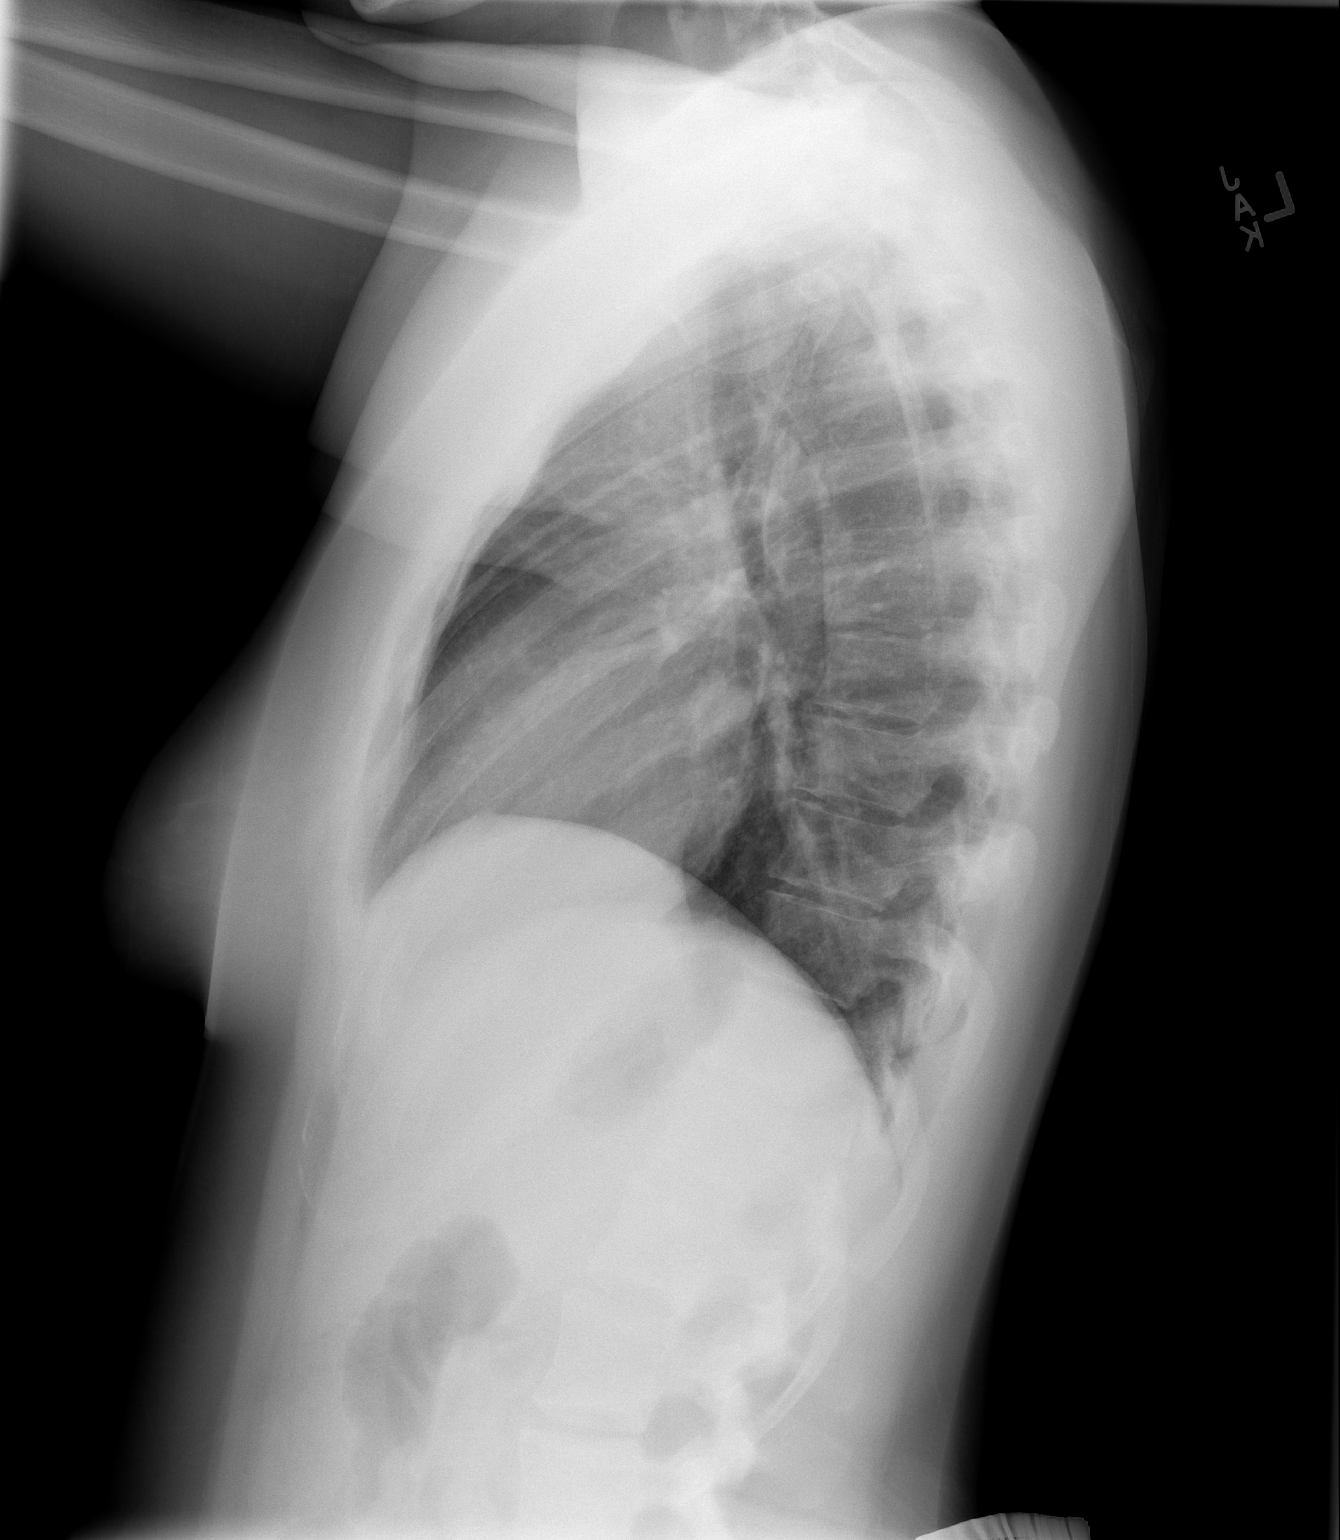

[2 of 2 positions shown; findings below may reference images not displayed]

FINDINGS: The heart size and mediastinal contours are within normal limits.
Both lungs are clear. The visualized skeletal structures are
unremarkable.
IMPRESSION: No active cardiopulmonary disease.  No evidence of pneumonia.

## 2020-04-14 ENCOUNTER — Telehealth (INDEPENDENT_AMBULATORY_CARE_PROVIDER_SITE_OTHER): Payer: 59 | Admitting: Psychiatry

## 2020-04-14 ENCOUNTER — Other Ambulatory Visit: Payer: Self-pay

## 2020-04-14 ENCOUNTER — Telehealth (HOSPITAL_COMMUNITY): Payer: Self-pay | Admitting: Psychiatry

## 2020-04-14 ENCOUNTER — Encounter (HOSPITAL_COMMUNITY): Payer: Self-pay

## 2020-04-14 DIAGNOSIS — F3342 Major depressive disorder, recurrent, in full remission: Secondary | ICD-10-CM | POA: Diagnosis not present

## 2020-04-14 DIAGNOSIS — F4010 Social phobia, unspecified: Secondary | ICD-10-CM

## 2020-04-14 MED ORDER — FLUOXETINE HCL 20 MG PO CAPS: 60.0000 mg | ORAL_CAPSULE | Freq: Every day | ORAL | 1 refills | Status: AC
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
## 2020-08-18 ENCOUNTER — Telehealth (HOSPITAL_COMMUNITY): Payer: 59 | Admitting: Psychiatry

## 2020-08-18 ENCOUNTER — Other Ambulatory Visit: Payer: Self-pay

## 2020-08-19 ENCOUNTER — Telehealth (HOSPITAL_COMMUNITY): Payer: 59 | Admitting: Psychiatry
